# Patient Record
Sex: Male | Born: 1970 | Race: White | Hispanic: No | Marital: Single | Smoking: Never smoker
Health system: Southern US, Community
[De-identification: ages and names within clinical notes are randomized; demographics above are authoritative.]

## PROBLEM LIST (undated history)

## (undated) DIAGNOSIS — I639 Cerebral infarction, unspecified: Secondary | ICD-10-CM

## (undated) DIAGNOSIS — G510 Bell's palsy: Secondary | ICD-10-CM

## (undated) HISTORY — DX: Cerebral infarction, unspecified: I63.9

---

## 2012-02-21 ENCOUNTER — Emergency Department: Payer: Self-pay | Admitting: Emergency Medicine

## 2012-11-26 ENCOUNTER — Emergency Department: Payer: Self-pay | Admitting: Emergency Medicine

## 2012-11-26 LAB — COMPREHENSIVE METABOLIC PANEL
Albumin: 4.3 g/dL (ref 3.4–5.0)
Anion Gap: 2 — ABNORMAL LOW (ref 7–16)
BUN: 11 mg/dL (ref 7–18)
Bilirubin,Total: 0.5 mg/dL (ref 0.2–1.0)
Calcium, Total: 9.3 mg/dL (ref 8.5–10.1)
Chloride: 105 mmol/L (ref 98–107)
Creatinine: 1.07 mg/dL (ref 0.60–1.30)
EGFR (Non-African Amer.): >60
Glucose: 103 mg/dL — ABNORMAL HIGH (ref 65–99)
Osmolality: 275 (ref 275–301)
Potassium: 3.6 mmol/L (ref 3.5–5.1)
SGPT (ALT): 52 U/L (ref 12–78)
Total Protein: 8.5 g/dL — ABNORMAL HIGH (ref 6.4–8.2)

## 2012-11-26 LAB — CBC
HCT: 47.6 % (ref 40.0–52.0)
MCV: 87 fL (ref 80–100)
Platelet: 263 10*3/uL (ref 150–440)
RDW: 13 % (ref 11.5–14.5)
WBC: 9.6 10*3/uL (ref 3.8–10.6)

## 2017-05-15 ENCOUNTER — Other Ambulatory Visit: Payer: Self-pay

## 2017-05-15 ENCOUNTER — Encounter: Payer: Self-pay | Admitting: Emergency Medicine

## 2017-05-15 ENCOUNTER — Emergency Department
Admission: EM | Admit: 2017-05-15 | Discharge: 2017-05-15 | Disposition: A | Discharge status: Home / Self Care | Payer: BLUE CROSS/BLUE SHIELD | Attending: Student in an Organized Health Care Education/Training Program | Admitting: Student in an Organized Health Care Education/Training Program

## 2017-05-15 DIAGNOSIS — G510 Bell's palsy: Secondary | ICD-10-CM | POA: Insufficient documentation

## 2017-05-15 DIAGNOSIS — R29810 Facial weakness: Secondary | ICD-10-CM | POA: Diagnosis present

## 2017-05-15 HISTORY — DX: Bell's palsy: G51.0

## 2017-05-15 MED ORDER — REFRESH LACRI-LUBE OP OINT
1.0000 [drp] | TOPICAL_OINTMENT | Freq: Four times a day (QID) | OPHTHALMIC | 0 refills | Status: DC
Start: 2017-05-15 — End: 2018-11-06

## 2017-05-15 MED ORDER — ACYCLOVIR 400 MG PO TABS: 400.0000 mg | ORAL_TABLET | Freq: Every day | ORAL | 0 refills | Status: AC

## 2017-05-15 MED ORDER — PREDNISONE 20 MG PO TABS: 60.0000 mg | ORAL_TABLET | Freq: Every day | ORAL | 0 refills | Status: AC

## 2017-05-15 NOTE — ED Notes (Signed)
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

## 2017-05-15 NOTE — ED Notes (Addendum)
FIRST NURSE NOTE:  Pt c/o facial droop and left eye pain that started 2-3 days ago, pt smile asymmetrical, and unable to close left eye, pt states he has hx of Bell's Palsy that presented the same.  Pt denies any weakness on one side or difficulty swallowing. Pt ambulatory in lobby, placed in wheelchair.

## 2017-05-15 NOTE — ED Triage Notes (Addendum)
L facial droop x 3 days, cant close eyelid. No other deficits. States history Bells palsy R side 2013. Unalbe to raise L brow. Case reviewed with ERMD, OK to flex.

## 2017-05-15 NOTE — ED Provider Notes (Signed)
[facility] Emergency Department Provider Note   ____________________________________________   First MD Initiated Contact with Patient 05/15/17 [phone]     (approximate)  I have reviewed the triage vital signs and the nursing notes.   HISTORY  Chief Complaint Facial Droop    HPI Joseph Davis is a 47 y.o. male patient complain of left facial droop and cannot close left eye for 2 days.  Patient denies loss of the vision disturbance or vertigo.  Patient has a history of Bell palsy in 2013 that was on the right side.  Patient rates discomfort as a 5/10.  No palliative measures for complaint.  Past Medical History:  Diagnosis Date  . Bell's palsy     There are no active problems to display for this patient.   History reviewed. No pertinent surgical history.  Prior to Admission medications   Medication Sig Start Date End Date Taking? Authorizing Provider  acyclovir (ZOVIRAX) 400 MG tablet Take 1 tablet (400 mg total) by mouth 5 (five) times daily for 10 days. 05/15/17 05/25/17  Joni Reining, PA-C  Artificial Tear Ointment (REFRESH LACRI-LUBE) OINT Apply 1 drop to eye 4 (four) times daily. 05/15/17   Joni Reining, PA-C  predniSONE (DELTASONE) 20 MG tablet Take 3 tablets (60 mg total) by mouth daily with breakfast for 7 days. 05/15/17 05/22/17  Joni Reining, PA-C    Allergies Patient has no known allergies.  No family history on file.  Social History Social History   Tobacco Use  . Smoking status: Not on file  Substance Use Topics  . Alcohol use: Not on file  . Drug use: Not on file    Review of Systems Constitutional: No fever/chills Eyes: No visual changes.  Unable to fully close left eye. ENT: No sore throat. Cardiovascular: Denies chest pain. Respiratory: Denies shortness of breath. Gastrointestinal: No abdominal pain.  No nausea, no vomiting.  No diarrhea.  No constipation. Genitourinary: Negative for  dysuria. Musculoskeletal: Negative for back pain. Skin: Negative for rash. Neurological: Negative for headaches.  Left facial droop and cannot close left eye..   ____________________________________________   PHYSICAL EXAM:  VITAL SIGNS: ED Triage Vitals  Enc Vitals Group     BP 05/15/17 0723 (!) 171/100     Pulse Rate 05/15/17 0723 96     Resp 05/15/17 0723 18     Temp 05/15/17 0723 98.3 F (36.8 C)     Temp src --      SpO2 05/15/17 0723 97 %     Weight 05/15/17 0724 210 lb (95.3 kg)     Height 05/15/17 0724 5' 10.5" (1.791 m)     Head Circumference --      Peak Flow --      Pain Score 05/15/17 0724 5     Pain Loc --      Pain Edu? --      Excl. in GC? --     Constitutional: Alert and oriented. Well appearing and in no acute distress. Eyes: Conjunctivae are normal. PERRL. EOMI. cannot fully close left eye.  Decreased range of motion with of the left eyebrow. Head: Atraumatic. Nose: No congestion/rhinnorhea. Mouth/Throat: Mucous membranes are moist.  Oropharynx non-erythematous. Neck: No stridor.  Hematological/Lymphatic/Immunilogical: No cervical lymphadenopathy. Cardiovascular: Normal rate, regular rhythm. Grossly normal heart sounds.  Good peripheral circulation. Respiratory: Normal respiratory effort.  No retractions. Lungs CTAB. Neurologic:  Normal speech and language.  Partial left facial nerve paralysis deficits are appreciated. No  gait instability. Skin:  Skin is warm, dry and intact. No rash noted. Psychiatric: Mood and affect are normal. Speech and behavior are normal.  ____________________________________________   LABS (all labs ordered are listed, but only abnormal results are displayed)  Labs Reviewed - No data to display ____________________________________________  EKG   ____________________________________________  RADIOLOGY  ED MD interpretation:    Official radiology report(s): No results  found.  ____________________________________________   PROCEDURES  Procedure(s) performed: None  Procedures  Critical Care performed: No  ____________________________________________   INITIAL IMPRESSION / ASSESSMENT AND PLAN / ED COURSE  As part of my medical decision making, I reviewed the following data within the electronic MEDICAL RECORD NUMBER   Bell's palsy left facial area.  Patient given discharge care instruction.  Patient given prescription for acyclovir and prednisone.  Patient advised [facility].  Patient given a work note.      ____________________________________________   FINAL CLINICAL IMPRESSION(S) / ED DIAGNOSES  Final diagnoses:  Bell's palsy     ED Discharge Orders        Ordered    predniSONE (DELTASONE) 20 MG tablet  Daily with breakfast     05/15/17 0831    acyclovir (ZOVIRAX) 400 MG tablet  5 times daily     05/15/17 0831    Artificial Tear Ointment (REFRESH LACRI-LUBE) OINT  4 times daily     05/15/17 [id number]       Note:  This document was prepared using Dragon voice recognition software and may include unintentional dictation errors.    Joni ReiningSmith, Ronald K, PA-C 05/15/17 [id number]    Willy Eddyobinson, Patrick, MD 05/15/17 [phone]

## 2018-10-31 ENCOUNTER — Other Ambulatory Visit: Payer: Self-pay | Admitting: Ophthalmology

## 2018-10-31 ENCOUNTER — Encounter: Payer: Self-pay | Admitting: Family Medicine

## 2018-10-31 DIAGNOSIS — H4901 Third [oculomotor] nerve palsy, right eye: Secondary | ICD-10-CM

## 2018-10-31 LAB — HM DIABETES EYE EXAM

## 2018-11-01 ENCOUNTER — Other Ambulatory Visit: Payer: Self-pay | Admitting: Ophthalmology

## 2018-11-01 ENCOUNTER — Ambulatory Visit
Admission: RE | Admit: 2018-11-01 | Discharge: 2018-11-01 | Disposition: A | Discharge status: Home / Self Care | Payer: BC Managed Care – PPO | Source: Ambulatory Visit | Attending: Ophthalmology | Admitting: Ophthalmology

## 2018-11-01 ENCOUNTER — Other Ambulatory Visit: Payer: Self-pay

## 2018-11-01 DIAGNOSIS — H4901 Third [oculomotor] nerve palsy, right eye: Secondary | ICD-10-CM | POA: Diagnosis present

## 2018-11-01 MED ORDER — GADOBUTROL 1 MMOL/ML IV SOLN
10.0000 mL | Freq: Once | INTRAVENOUS | Status: AC | PRN
  Administered 2018-11-01: 10 mL via INTRAVENOUS

## 2018-11-06 ENCOUNTER — Other Ambulatory Visit: Payer: Self-pay

## 2018-11-06 ENCOUNTER — Ambulatory Visit (INDEPENDENT_AMBULATORY_CARE_PROVIDER_SITE_OTHER): Payer: BC Managed Care – PPO | Admitting: Family Medicine

## 2018-11-06 ENCOUNTER — Encounter: Payer: Self-pay | Admitting: Family Medicine

## 2018-11-06 VITALS — BP 126/68 | HR 97 | Temp 98.9°F | Resp 16 | Ht 69.5 in | Wt 191.0 lb

## 2018-11-06 DIAGNOSIS — Q040 Congenital malformations of corpus callosum: Secondary | ICD-10-CM

## 2018-11-06 DIAGNOSIS — H4901 Third [oculomotor] nerve palsy, right eye: Secondary | ICD-10-CM | POA: Diagnosis not present

## 2018-11-06 DIAGNOSIS — E1165 Type 2 diabetes mellitus with hyperglycemia: Secondary | ICD-10-CM | POA: Diagnosis not present

## 2018-11-06 DIAGNOSIS — E559 Vitamin D deficiency, unspecified: Secondary | ICD-10-CM

## 2018-11-06 DIAGNOSIS — Z8349 Family history of other endocrine, nutritional and metabolic diseases: Secondary | ICD-10-CM | POA: Diagnosis not present

## 2018-11-06 DIAGNOSIS — E538 Deficiency of other specified B group vitamins: Secondary | ICD-10-CM

## 2018-11-06 LAB — COMPREHENSIVE METABOLIC PANEL
ALT: 34 U/L (ref 0–53)
AST: 29 U/L (ref 0–37)
Albumin: 4.7 g/dL (ref 3.5–5.2)
Alkaline Phosphatase: 80 U/L (ref 39–117)
BUN: 18 mg/dL (ref 6–23)
CO2: 28 mEq/L (ref 19–32)
Calcium: 9.9 mg/dL (ref 8.4–10.5)
Chloride: 99 mEq/L (ref 96–112)
Creatinine, Ser: 0.95 mg/dL (ref 0.40–1.50)
GFR: 84.03 mL/min (ref >60.00)
Glucose, Bld: 274 mg/dL — ABNORMAL HIGH (ref 70–99)
Potassium: 4.1 mEq/L (ref 3.5–5.1)
Sodium: 136 mEq/L (ref 135–145)
Total Bilirubin: 0.7 mg/dL (ref 0.2–1.2)
Total Protein: 7.4 g/dL (ref 6.0–8.3)

## 2018-11-06 LAB — B12 AND FOLATE PANEL
Folate: >23.9 ng/mL (ref >5.9)
Vitamin B-12: 209 pg/mL — ABNORMAL LOW (ref 211–911)

## 2018-11-06 LAB — CBC WITH DIFFERENTIAL/PLATELET
Basophils Absolute: 0.1 10*3/uL (ref 0.0–0.1)
Basophils Relative: 0.7 % (ref 0.0–3.0)
Eosinophils Absolute: 0.2 10*3/uL (ref 0.0–0.7)
Eosinophils Relative: 2.6 % (ref 0.0–5.0)
HCT: 45.1 % (ref 39.0–52.0)
Hemoglobin: 15.1 g/dL (ref 13.0–17.0)
Lymphocytes Relative: 22.2 % (ref 12.0–46.0)
Lymphs Abs: 1.6 10*3/uL (ref 0.7–4.0)
MCHC: 33.5 g/dL (ref 30.0–36.0)
MCV: 89.8 fl (ref 78.0–100.0)
Monocytes Absolute: 0.6 10*3/uL (ref 0.1–1.0)
Monocytes Relative: 9 % (ref 3.0–12.0)
Neutro Abs: 4.7 10*3/uL (ref 1.4–7.7)
Neutrophils Relative %: 65.5 % (ref 43.0–77.0)
Platelets: 225 10*3/uL (ref 150.0–400.0)
RBC: 5.02 Mil/uL (ref 4.22–5.81)
RDW: 12.8 % (ref 11.5–15.5)
WBC: 7.2 10*3/uL (ref 4.0–10.5)

## 2018-11-06 LAB — LIPID PANEL
Cholesterol: 171 mg/dL (ref 0–200)
HDL: 35.6 mg/dL — ABNORMAL LOW (ref >39.00)
LDL Cholesterol: 106 mg/dL — ABNORMAL HIGH (ref 0–99)
NonHDL: 134.93
Total CHOL/HDL Ratio: 5
Triglycerides: 146 mg/dL (ref 0.0–149.0)
VLDL: 29.2 mg/dL (ref 0.0–40.0)

## 2018-11-06 LAB — TSH: TSH: 0.78 u[IU]/mL (ref 0.35–4.50)

## 2018-11-06 LAB — VITAMIN D 25 HYDROXY (VIT D DEFICIENCY, FRACTURES): VITD: 28.23 ng/mL — ABNORMAL LOW (ref 30.00–100.00)

## 2018-11-06 LAB — HEMOGLOBIN A1C: Hgb A1c MFr Bld: 11.1 % — ABNORMAL HIGH (ref 4.6–6.5)

## 2018-11-06 MED ORDER — SITAGLIPTIN PHOSPHATE 50 MG PO TABS: 50.0000 mg | ORAL_TABLET | Freq: Every day | ORAL | 1 refills | Status: DC

## 2018-11-06 MED ORDER — CYANOCOBALAMIN 500 MCG PO TABS: 500.0000 ug | ORAL_TABLET | Freq: Every day | ORAL | 0 refills | Status: DC

## 2018-11-06 MED ORDER — METFORMIN HCL 1000 MG PO TABS: ORAL_TABLET | ORAL | 3 refills | Status: DC

## 2018-11-06 MED ORDER — ERGOCALCIFEROL 50 MCG (2000 UT) PO TABS: 1.0000 | ORAL_TABLET | Freq: Every day | ORAL | 1 refills | Status: DC

## 2018-11-06 MED ORDER — BLOOD GLUCOSE METER KIT: PACK | 0 refills | Status: DC

## 2018-11-06 NOTE — Progress Notes (Addendum)
Subjective:    Patient ID: Joseph Davis, male    DOB: 10-13-1970, 48 y.o.   MRN: [id number]  HPI  Patient presents to clinic as a new patient with complaint of right eye being swollen.  Patient does have a history of Bell's palsy (right eye/side of face) that was diagnosed in February 2019 after he had shingles.   He was also seen on 10/26/2018 by neuro clinic and diagnosed with a corneal abrasion of right eye.  He was prescribed erythromycin ointment.  He was referred to ophthalmology, saw Dr. Druscilla BrowniePorfilio at [facility]. MRI/MRA done to investigate suspected 3rd nerve palsy of right eye; I was able to review imaging. They have opthalmology follow up scheduled for 11/30/2018. EXAM: MRI HEAD WITHOUT AND WITH CONTRAST  MRA HEAD WITHOUT CONTRAST  TECHNIQUE: Multiplanar, multiecho pulse sequences of the brain and surrounding structures were obtained without and with intravenous contrast. Angiographic images of the head were obtained using MRA technique without contrast.  CONTRAST:  10 mL Gadovist IV  COMPARISON:  CT head 11/26/2012  FINDINGS: MRI HEAD FINDINGS  Brain: Complete agenesis corpus callosum. No lipoma. 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  Negative for acute infarct. Mild white matter disease bilaterally mostly in the parietal white matter. Scattered small white matter hyperintensities also in the frontal lobes. 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 Chronic microhemorrhage associated with the right frontal chronic infarct. Chronic microhemorrhage also in the right posterior temporal lobe and left cerebellum.  Negative for mass or edema. Normal enhancement postcontrast administration.  Vascular: Normal arterial flow voids.  Skull and upper cervical spine: Negative  Sinuses/Orbits: Mucosal edema left maxillary sinus.  Normal orbit  Other: None  MRA HEAD FINDINGS  Both vertebral  arteries widely patent to the basilar. Right PICA patent. Prominent left AICA appears to supply left PICA territory. Basilar widely patent. The superior cerebellar and posterior cerebral arteries are patent bilaterally. Patent posterior communicating artery on the left. Negative for posterior communicating artery aneurysm.  Internal carotid artery widely patent bilaterally. Anterior and middle cerebral arteries widely patent bilaterally without stenosis.  Negative for cerebral aneurysm.  IMPRESSION: 1. Negative for cerebral aneurysm. No cause for right-sided third nerve palsy 2. Complete agenesis corpus callosum 3. Mild chronic ischemic changes in the white matter. 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 Additional small areas of microhemorrhage in the right posterior temporal lobe and left cerebellum. Correlate with history of hypertension. No acute Infarct.  Patient Active Problem List   Diagnosis Date Noted  . Agenesis of corpus callosum (HCC) 11/06/2018  . Family history of endocrine and metabolic disease 78/29/562107/28/2020  . 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 Bell's palsy   . Stroke [facility](HCC)    History reviewed. No pertinent surgical history.  Family History  Problem Relation Age of Onset  . Diabetes Mother   . Diabetes Father     Review of Systems   Constitutional: Negative for chills, fatigue and fever.  HENT: Negative for congestion, ear pain, sinus pain and sore throat.   Eyes: Negative.   Respiratory: Negative for cough, shortness of breath and wheezing.   Cardiovascular: Negative for chest pain, palpitations and leg swelling.  Gastrointestinal: Negative for abdominal pain, diarrhea, nausea and vomiting.  Genitourinary: Negative for dysuria, frequency and urgency.  Musculoskeletal: Negative for arthralgias and myalgias.  Skin: Negative for color change, pallor and rash.  Neurological: Negative for  syncope, light-headedness and headaches. +right eye shut Psychiatric/Behavioral: The patient is not nervous/anxious.       Objective:   Physical Exam Vitals signs and nursing note reviewed.  Constitutional:      General: He is not in acute distress.    Appearance: He is normal weight. He is not ill-appearing, toxic-appearing or diaphoretic.  HENT:     Head: Atraumatic.     Right Ear: Tympanic membrane, ear canal and external ear normal.     Left Ear: Tympanic membrane, ear canal and external ear normal.  Eyes:     General: No scleral icterus.       Right eye: No discharge.        Left eye: No discharge.     Extraocular Movements: Extraocular movements intact.     Pupils: Pupils are equal, round, and reactive to light.     Comments: Right eye is closed. Unable to open right eyelid on his own. When I manually open lid, ROM of eye is intact and no discharge seen from right eye.   Neck:     Musculoskeletal: Normal range of motion and neck supple.     Vascular: No carotid bruit.  Cardiovascular:     Rate and Rhythm: Normal rate and regular rhythm.     Heart sounds: Normal heart sounds.  Pulmonary:     Effort: Pulmonary effort is normal. No respiratory distress.     Breath sounds: Normal breath sounds.  Musculoskeletal:     Right lower leg: No edema.     Left lower leg: No edema.  Skin:    General: Skin is warm and dry.     Capillary Refill: Capillary refill takes less than 2 seconds.     Coloration: Skin is not jaundiced or pale.  Neurological:     Mental Status: He is alert and oriented to person, place, and time.     Gait: Gait normal.  Psychiatric:        Mood and Affect: Mood normal.        Behavior: Behavior normal.     Vitals:   11/06/18 0904  BP: 126/68  Pulse: 97  Resp: 16  Temp: 98.9 F (37.2 C)  SpO2: 93%   Body mass index is 27.8 kg/m.     Assessment & Plan:   Third nerve palsy right eye, Agenesis of corpus callosum --MRI and MRA imaging of brain did  not reveal any cause for the.  It did find agenesis of corpus callosum.  I will place a neurology referral for further evaluation of reason why right eyelid is shut and for any monitoring/further work up that may be needed for agenesis of corpus callosum & finding of chronic right frontal infarct noted on brain imaging.  Corneal Abrasion --no signs of infection noted in right eye.  We will keep appointment as planned in August with ophthalmology for continued monitoring.  Family history of endocrine/metabolic disorder - patient has diabetes both in his mother's and father's family history, so we will get blood work today including CBC, lipid panel, thyroid panel and A1c.  Type 2 DM -- patient's A1c and lab work feels he is a diabetic.  We will start him on metformin and Januvia and send him to diabetes education.   B12 deficiency/vitamin D deficiency-lab work reveals patient is deficient in B12 vitamin D.  Will replace.  Patient and mother made aware that they do not hear anything about the neurology referral in the next few days  to call office and let us know.  Advised if any symptoms change or anything new develops to call office right away for advice and or go to ER for evaluation.  We will plan to have patient follow-up in office in the next 2 or 3 weeks to see how he is tolerating new medications.

## 2018-11-06 NOTE — Addendum Note (Signed)
Addended by: Philis Nettle on: 11/06/2018 02:58 PM   Modules accepted: Orders, Level of Service

## 2018-11-12 ENCOUNTER — Ambulatory Visit: Payer: BLUE CROSS/BLUE SHIELD

## 2018-11-20 ENCOUNTER — Other Ambulatory Visit: Payer: Self-pay

## 2018-11-22 ENCOUNTER — Other Ambulatory Visit: Payer: Self-pay

## 2018-11-22 ENCOUNTER — Encounter: Payer: Self-pay | Admitting: Family Medicine

## 2018-11-22 ENCOUNTER — Ambulatory Visit (INDEPENDENT_AMBULATORY_CARE_PROVIDER_SITE_OTHER): Payer: BC Managed Care – PPO | Admitting: Family Medicine

## 2018-11-22 VITALS — BP 128/82 | HR 76 | Temp 97.3°F | Resp 16 | Ht 69.5 in | Wt 190.2 lb

## 2018-11-22 DIAGNOSIS — Q040 Congenital malformations of corpus callosum: Secondary | ICD-10-CM

## 2018-11-22 DIAGNOSIS — E1165 Type 2 diabetes mellitus with hyperglycemia: Secondary | ICD-10-CM

## 2018-11-22 DIAGNOSIS — E538 Deficiency of other specified B group vitamins: Secondary | ICD-10-CM | POA: Diagnosis not present

## 2018-11-22 DIAGNOSIS — E559 Vitamin D deficiency, unspecified: Secondary | ICD-10-CM | POA: Diagnosis not present

## 2018-11-22 DIAGNOSIS — H4901 Third [oculomotor] nerve palsy, right eye: Secondary | ICD-10-CM

## 2018-11-22 NOTE — Patient Instructions (Signed)
Keep up the great work!

## 2018-11-22 NOTE — Progress Notes (Signed)
Subjective:    Patient ID: Joseph Davis, male    DOB: 1970/11/29, 48 y.o.   MRN: [id number]  HPI   Patient presents to clinic for follow-up on diabetes after we discovered he was diabetic through lab work and starting new medicines.  He is currently taking the metformin twice a day and Januvia once daily.  He has been tracking his blood sugars and even over the past 2 weeks he has noticed a trend down.  He is now running mainly in the 120s to 190s.  Denies any hypoglycemia.  Also states he has been working on making better food choices now that he knows he is diabetic and has been making sure he is especially avoiding regular sodas and high sugar content foods.  He is tolerating the new medications without any adverse effects, denies any abdominal discomfort.  Also is tolerating oral vitamin B12 and vitamin D without any issues.  He was revealed to be low in his blood work.  3rd nerve palsy of right eye seems to be slowly improving.  Right eye is now open.  He does have scheduled follow-up with ophthalmology on August 21.  Lab Results  Component Value Date   HGBA1C 11.1 (H) 11/06/2018   CMP Latest Ref Rng & Units 11/06/2018 11/26/2012  Glucose 70 - 99 mg/dL 409(W274(H) 119(J103(H)  BUN 6 - 23 mg/dL 18 11  Creatinine 4.780.40 - 1.50 mg/dL 2.950.95 6.211.07  Sodium 308135 - 145 mEq/L 136 138  Potassium 3.5 - 5.1 mEq/L 4.1 3.6  Chloride 96 - 112 mEq/L 99 105  CO2 19 - 32 mEq/L 28 31  Calcium 8.4 - 10.5 mg/dL 9.9 9.3  Total Protein 6.0 - 8.3 g/dL 7.4 6.5(H8.5(H)  Total Bilirubin 0.2 - 1.2 mg/dL 0.7 0.5  Alkaline Phos 39 - 117 U/L 80 83  AST 0 - 37 U/L 29 54(H)  ALT 0 - 53 U/L 34 52   Patient Active Problem List   Diagnosis Date Noted  . Agenesis of corpus callosum (HCC) 11/06/2018  . Family history of endocrine and metabolic disease 84/69/629507/28/2020  . 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 Smoking status: Never Smoker  . Smokeless tobacco: Never Used  Substance  Use Topics  . Alcohol use: Never    Frequency: Never     Review of Systems   Constitutional: Negative for chills, fatigue and fever.  HENT: Negative for congestion, ear pain, sinus pain and sore throat.   Eyes: right eye now open Respiratory: Negative for cough, shortness of breath and wheezing.   Cardiovascular: Negative for chest pain, palpitations and leg swelling.  Gastrointestinal: Negative for abdominal pain, diarrhea, nausea and vomiting.  Genitourinary: Negative for dysuria, frequency and urgency.  Musculoskeletal: Negative for arthralgias and myalgias.  Skin: Negative for color change, pallor and rash.  Neurological: Negative for syncope, light-headedness and headaches.  Psychiatric/Behavioral: The patient is not nervous/anxious.       Objective:   Physical Exam Vitals signs and nursing note reviewed.  Constitutional:      General: He is not in acute distress.    Appearance: He is not ill-appearing, toxic-appearing or diaphoretic.  HENT:     Head: Normocephalic and atraumatic.     Right Ear: Tympanic membrane, ear canal and external ear normal.     Left Ear: Tympanic membrane, ear canal and external ear normal.  Eyes:     General: No scleral icterus.  Extraocular Movements: Extraocular movements intact.     Pupils: Pupils are equal, round, and reactive to light.     Comments: Right eye is now open.  Cardiovascular:     Rate and Rhythm: Normal rate and regular rhythm.     Heart sounds: Normal heart sounds.  Pulmonary:     Effort: Pulmonary effort is normal.     Breath sounds: Normal breath sounds.  Musculoskeletal:     Right lower leg: No edema.     Left lower leg: No edema.  Skin:    General: Skin is warm and dry.     Capillary Refill: Capillary refill takes less than 2 seconds.     Coloration: Skin is not jaundiced or pale.  Neurological:     Mental Status: He is alert and oriented to person, place, and time.     Gait: Gait normal.  Psychiatric:         Mood and Affect: Mood normal.        Behavior: Behavior normal.     Today's Vitals   11/22/18 0819  BP: 128/82  Pulse: 76  Resp: 16  Temp: (!) 97.3 F (36.3 C)  TempSrc: Oral  SpO2: 96%  Weight: 190 lb 3.2 oz (86.3 kg)  Height: 5' 9.5" (1.765 m)   Body mass index is 27.68 kg/m.     Assessment & Plan:   Type 2 diabetes-patient tolerating metformin and Januvia without any problems.  Blood sugars have already started to respond to these medications.  He will continue these medicines and also check blood sugar 1-2 times a day and keep a log of his readings.  Reviewed healthy diet and watching for carbohydrates and added sugars and foods.  Vitamin D and B12 deficiency-he will continue vitamin D and vitamin B12 oral supplements.  We will plan to recheck these levels at next visit in his blood work.  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  Agenesis of corpus callosum --neurology referral is in place.   Patient will follow-up in October 2020 as already planned and we will do new blood work at that time.  He is aware he can return to clinic sooner if any issues arise.

## 2019-02-07 ENCOUNTER — Telehealth: Payer: Self-pay | Admitting: Family Medicine

## 2019-02-07 NOTE — Telephone Encounter (Signed)
Left multiple messages to confirm appt for 10/30 and to go over travel screening  Pt hasn't returned call

## 2019-02-08 ENCOUNTER — Ambulatory Visit (INDEPENDENT_AMBULATORY_CARE_PROVIDER_SITE_OTHER): Payer: BC Managed Care – PPO | Admitting: Family Medicine

## 2019-02-08 ENCOUNTER — Other Ambulatory Visit: Payer: Self-pay

## 2019-02-08 VITALS — BP 140/78 | HR 68 | Temp 97.7°F | Resp 16 | Ht 69.5 in | Wt 182.0 lb

## 2019-02-08 DIAGNOSIS — E1165 Type 2 diabetes mellitus with hyperglycemia: Secondary | ICD-10-CM | POA: Diagnosis not present

## 2019-02-08 DIAGNOSIS — E538 Deficiency of other specified B group vitamins: Secondary | ICD-10-CM | POA: Diagnosis not present

## 2019-02-08 DIAGNOSIS — E559 Vitamin D deficiency, unspecified: Secondary | ICD-10-CM | POA: Diagnosis not present

## 2019-02-08 DIAGNOSIS — Z23 Encounter for immunization: Secondary | ICD-10-CM

## 2019-02-08 LAB — BASIC METABOLIC PANEL
BUN: 10 mg/dL (ref 6–23)
CO2: 31 mEq/L (ref 19–32)
Calcium: 9.5 mg/dL (ref 8.4–10.5)
Chloride: 103 mEq/L (ref 96–112)
Creatinine, Ser: 0.86 mg/dL (ref 0.40–1.50)
GFR: 94.16 mL/min (ref >60.00)
Glucose, Bld: 114 mg/dL — ABNORMAL HIGH (ref 70–99)
Potassium: 3.9 mEq/L (ref 3.5–5.1)
Sodium: 141 mEq/L (ref 135–145)

## 2019-02-08 LAB — VITAMIN B12: Vitamin B-12: 229 pg/mL (ref 211–911)

## 2019-02-08 LAB — HEMOGLOBIN A1C: Hgb A1c MFr Bld: 6.2 % (ref 4.6–6.5)

## 2019-02-08 LAB — VITAMIN D 25 HYDROXY (VIT D DEFICIENCY, FRACTURES): VITD: 28.17 ng/mL — ABNORMAL LOW (ref 30.00–100.00)

## 2019-02-08 NOTE — Progress Notes (Signed)
Subjective:    Patient ID: Joseph Davis, male    DOB: 1970/10/22, 48 y.o.   MRN: [id number]  HPI   Patient presents to clinic for follow-up on his diabetes.  We will also follow-up on vitamin D and B12 deficiency since we have had him taking supplements for the past 3 months.  Patient doing well with Metformin.  Checking his blood sugar usually twice per day.  Ranges usually from 90-140s.  Denies any hypoglycemia.  Denies any readings over 200.  Patient Active Problem List   Diagnosis Date Noted  . Agenesis of corpus callosum ([place]) 11/06/2018  . Family history of endocrine and metabolic disease 70/62/3762  . 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 Smoking status: Never Smoker  . Smokeless tobacco: Never Used  Substance Use Topics  . Alcohol use: Never    Frequency: Never   Review of Systems  Constitutional: Negative for chills, fatigue and fever.  HENT: Negative for congestion, ear pain, sinus pain and sore throat.   Eyes: Negative.   Respiratory: Negative for cough, shortness of breath and wheezing.   Cardiovascular: Negative for chest pain, palpitations and leg swelling.  Gastrointestinal: Negative for abdominal pain, diarrhea, nausea and vomiting.  Genitourinary: Negative for dysuria, frequency and urgency.  Musculoskeletal: Negative for arthralgias and myalgias.  Skin: Negative for color change, pallor and rash.  Neurological: Negative for syncope, light-headedness and headaches.  Psychiatric/Behavioral: The patient is not nervous/anxious.       Objective:   Physical Exam Vitals signs and nursing note reviewed.  Constitutional:      General: He is not in acute distress.    Appearance: He is not ill-appearing, toxic-appearing or diaphoretic.  HENT:     Head: Normocephalic and atraumatic.  Cardiovascular:     Rate and Rhythm: Normal rate and regular rhythm.     Heart sounds: Normal heart sounds.  Pulmonary:   Effort: Pulmonary effort is normal. No respiratory distress.  Musculoskeletal:     Right lower leg: No edema.     Left lower leg: No edema.  Skin:    General: Skin is warm and dry.     Coloration: Skin is not jaundiced or pale.  Neurological:     Mental Status: He is alert and oriented to person, place, and time.     Gait: Gait normal.  Psychiatric:        Mood and Affect: Mood normal.        Behavior: Behavior normal.    Today's Vitals   02/08/19 0953  BP: 140/78  Pulse: 68  Resp: 16  Temp: 97.7 F (36.5 C)  SpO2: 98%  Weight: 182 lb (82.6 kg)  Height: 5' 9.5" (1.765 m)      Assessment & Plan:    Type 2 diabetes mellitus with hyperglycemia, without long-term current use of insulin (HCC) - Plan: Hemoglobin G3T, Basic metabolic panel  D17 deficiency - Plan: B12  Vitamin D deficiency - Plan: VITAMIN D 25 Hydroxy (Vit-D Deficiency, Fractures)  Need for influenza vaccination - Plan: Flu Vaccine QUAD 6+ mos PF IM (Fluarix Quad PF)  I am very impressed with patient's diabetes numbers.  We will get new A1c today.  He will also get B12 and vitamin D levels to follow-up on how well the oral replacement is working.  Flu vaccine given in clinic.  Patient will continue on current regimen unless any labs reveal we need to make  changes.  He will follow-up regularly every 6 months for recheck.  He can return to clinic sooner whenever needed.

## 2019-02-08 NOTE — Patient Instructions (Signed)
You are doing amazing! Keep up the great work!

## 2019-02-11 MED ORDER — CYANOCOBALAMIN 500 MCG PO TABS: 500.0000 ug | ORAL_TABLET | Freq: Every day | ORAL | 0 refills | Status: DC

## 2019-02-11 MED ORDER — ERGOCALCIFEROL 50 MCG (2000 UT) PO TABS: 1.0000 | ORAL_TABLET | Freq: Every day | ORAL | 1 refills | Status: DC

## 2019-02-11 MED ORDER — SITAGLIPTIN PHOSPHATE 50 MG PO TABS: 50.0000 mg | ORAL_TABLET | Freq: Every day | ORAL | 1 refills | Status: DC

## 2019-02-11 NOTE — Addendum Note (Signed)
Addended by: Philis Nettle on: 02/11/2019 08:08 AM   Modules accepted: Orders

## 2019-02-26 ENCOUNTER — Encounter: Payer: Self-pay | Admitting: *Deleted

## 2019-05-17 ENCOUNTER — Encounter: Payer: Self-pay | Admitting: Family

## 2019-05-17 ENCOUNTER — Telehealth: Payer: Self-pay | Admitting: Family

## 2019-05-17 ENCOUNTER — Other Ambulatory Visit: Payer: Self-pay

## 2019-05-17 ENCOUNTER — Ambulatory Visit (INDEPENDENT_AMBULATORY_CARE_PROVIDER_SITE_OTHER): Payer: BC Managed Care – PPO | Admitting: Family

## 2019-05-17 VITALS — BP 130/82 | HR 79 | Temp 97.8°F | Ht 69.5 in | Wt 185.6 lb

## 2019-05-17 DIAGNOSIS — E559 Vitamin D deficiency, unspecified: Secondary | ICD-10-CM | POA: Diagnosis not present

## 2019-05-17 DIAGNOSIS — Z125 Encounter for screening for malignant neoplasm of prostate: Secondary | ICD-10-CM | POA: Diagnosis not present

## 2019-05-17 DIAGNOSIS — E1165 Type 2 diabetes mellitus with hyperglycemia: Secondary | ICD-10-CM | POA: Diagnosis not present

## 2019-05-17 DIAGNOSIS — R079 Chest pain, unspecified: Secondary | ICD-10-CM

## 2019-05-17 DIAGNOSIS — E538 Deficiency of other specified B group vitamins: Secondary | ICD-10-CM | POA: Diagnosis not present

## 2019-05-17 DIAGNOSIS — Z23 Encounter for immunization: Secondary | ICD-10-CM

## 2019-05-17 DIAGNOSIS — I10 Essential (primary) hypertension: Secondary | ICD-10-CM | POA: Insufficient documentation

## 2019-05-17 DIAGNOSIS — Z1211 Encounter for screening for malignant neoplasm of colon: Secondary | ICD-10-CM

## 2019-05-17 MED ORDER — METFORMIN HCL 1000 MG PO TABS: ORAL_TABLET | ORAL | 3 refills | Status: DC

## 2019-05-17 MED ORDER — CYANOCOBALAMIN 500 MCG PO TABS: 500.0000 ug | ORAL_TABLET | Freq: Every day | ORAL | 4 refills | Status: DC

## 2019-05-17 MED ORDER — ERGOCALCIFEROL 50 MCG (2000 UT) PO CAPS: 1.0000 | ORAL_CAPSULE | Freq: Every day | ORAL | 4 refills | Status: DC

## 2019-05-17 NOTE — Assessment & Plan Note (Signed)
Discussed at length with patient episodes of chest pain which she had.  Last episode a month ago. EKG  shows normal sinus rhythm, heart rate 74.  Unfortunately no prior EKG to compare to.  I was able to produce pain on exam.  Discussed with patient that likely his symptoms are musculoskeletal based on my exam.  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  I also emphasized the patient and mother if patient if he were to have another episode of pain, I would like for him to seek immediate medical attention and go to the nearest emergency room due age, CVD risk.  Patient verbalized understanding.

## 2019-05-17 NOTE — Addendum Note (Signed)
Addended by: Warden Fillers on: 05/17/2019 10:52 AM   Modules accepted: Orders

## 2019-05-17 NOTE — Assessment & Plan Note (Addendum)
Lab Results  Component Value Date   HGBA1C 6.2 02/08/2019   Has been controlled however Januvia is costing $120 a month.  I have placed a chronic care management referral for further guidance with this.

## 2019-05-17 NOTE — Progress Notes (Signed)
Subjective:    Patient ID: Joseph Davis, male    DOB: July 22, 1970, 49 y.o.   MRN: [id number]  CC: Joseph Davis is a 49 y.o. male who presents today to establish care.    HPI: Here to establish care.  Transferring care.  Accompanied by his mother today. History of CVA, Bell's palsy. Diabetes-compliant with Metformin 1000 mg twice daily.  Fasting blood glucose average less than 120.  No numbness in his feet.  Breakfast consists of typically oatmeal, lunch half sandwich with chips and supper meat with potatoes and beans.  Focuses on portion control.  He may have a sugar.  Drinks diet soda. He does complain of a sharp left sided chest pain that first occurred a month ago.  The only occurs "once in a blue moon".  It may occur with activity or when he sitting still.  Last episode 1 month ago.  He states he had approximately 1 or 2 of these in a day last for 2 seconds resolved on its own.  Episode is not associate with nausea, left arm pain or numbness, a meal, shortness of breath, diaphoresis.  He works at [employer]; he does stocking.  He does not feel he has had any injury.  He has never been to cardiology.  No trouble urinating, hesistancy   HISTORY:  Past Medical History:  Diagnosis Date  . Bell's palsy   . Stroke [facility])    History reviewed. No pertinent surgical history. Family History  Problem Relation Age of Onset  . Diabetes Mother   . Diabetes Father     Allergies: Patient has no known allergies. Current Outpatient Medications on File Prior to Visit  Medication Sig Dispense Refill  . blood glucose meter kit and supplies Dispense based on patient and insurance preference. Use up to four times daily as directed. (FOR ICD-10 E10.9, E11.9).  Check blood sugar up to 4 times per day. 1 each 0  . sitaGLIPtin (JANUVIA) 50 MG tablet Take 1 tablet (50 mg total) by mouth daily. 90 tablet 1   No current facility-administered medications on file prior to visit.    Social  History   Tobacco Use  . Smoking status: Never Smoker  . Smokeless tobacco: Never Used  Substance Use Topics  . Alcohol use: Never  . Drug use: Never    Review of Systems  Constitutional: Negative for chills and fever.  Respiratory: Negative for cough, chest tightness and shortness of breath.   Cardiovascular: Positive for chest pain. Negative for palpitations and leg swelling.  Gastrointestinal: Negative for nausea and vomiting.  Genitourinary: Negative for decreased urine volume and difficulty urinating.  Neurological: Negative for numbness and headaches.      Objective:    BP 130/82   Pulse 79   Temp 97.8 F (36.6 C) (Temporal)   Ht 5' 9.5" (1.765 m)   Wt 185 lb 9.6 oz (84.2 kg)   SpO2 97%   BMI 27.02 kg/m  BP Readings from Last 3 Encounters:  05/17/19 130/82  02/08/19 140/78  11/22/18 128/82   Wt Readings from Last 3 Encounters:  05/17/19 185 lb 9.6 oz (84.2 kg)  02/08/19 182 lb (82.6 kg)  11/22/18 190 lb 3.2 oz (86.3 kg)    Physical Exam Vitals reviewed.  Constitutional:      Appearance: He is well-developed.  Cardiovascular:     Rate and Rhythm: Regular rhythm.     Heart sounds: Normal heart sounds.  Pulmonary:     Effort:  Pulmonary effort is normal. No respiratory distress.     Breath sounds: Normal breath sounds. No wheezing, rhonchi or rales.  Chest:       Comments: Palpable area of tenderness as marked on diagram, over rib cage/ midclavicular line. Musculoskeletal:     Right shoulder: Normal.     Left shoulder: Normal.     Comments: NO chest pain with moving of shoulders.  Skin:    General: Skin is warm and dry.  Neurological:     Mental Status: He is alert.  Psychiatric:        Speech: Speech normal.        Behavior: Behavior normal.        Assessment & Plan:   Problem List Items Addressed This Visit      Cardiovascular and Mediastinum   HTN (hypertension)    BP Readings from Last 3 Encounters:  05/17/19 130/82  02/08/19 140/78    11/22/18 128/82   Blood pressures been consistently greater than 120/80.  Particularly in setting of DM ,We will start losartan today for HTN control, renal protection.Will likely start asa 53m and statin. Labs in one week. Close f/u        Endocrine   Type 2 diabetes mellitus with hyperglycemia, without long-term current use of insulin ([place]    Lab Results  Component Value Date   HGBA1C 6.2 02/08/2019   Has been controlled however Januvia is costing $120 a month.  I have placed a chronic care management referral for further guidance with this.      Relevant Orders   Referral to Chronic Care Management Services   Hemoglobin A1c   Comprehensive metabolic panel   Lipid panel   Microalbumin / creatinine urine ratio     Other   B12 deficiency   Chest pain    Discussed at length with patient episodes of chest pain which she had.  Last episode a month ago. EKG  shows normal sinus rhythm, heart rate 74.  Unfortunately no prior EKG to compare to.  I was able to produce pain on exam.  Discussed with patient that likely his symptoms are musculoskeletal based on my exam.  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  I also emphasized the patient and mother if patient if he were to have another episode of pain, I would like for him to seek immediate medical attention and go to the nearest emergency room due age, CVD risk.  Patient verbalized understanding.       Other Visit Diagnoses    Vitamin D deficiency       Screening for prostate cancer       Relevant Orders   PSA   Screen for colon cancer       Relevant Orders   Ambulatory referral to Gastroenterology   Need for 23-polyvalent pneumococcal polysaccharide vaccine       Relevant Orders   Pneumococcal polysaccharide vaccine 23-valent greater than or equal to 2yo subcutaneous/IM (Completed)   Need for Tdap vaccination       Relevant Orders    Tdap vaccine greater than or equal to 7yo IM (Completed)    Of note, he will restart VitD and B12 since didn't start 01/2019.    I have discontinued CByrd Hesselbachvitamin B-12 and Ergocalciferol. I am also having him start on Ergocalciferol and vitamin B-12. Additionally, I am having him maintain his blood  glucose meter kit and supplies and sitaGLIPtin.   Meds ordered this encounter  Medications  . Ergocalciferol 50 MCG (2000 UT) CAPS    Sig: Take 1 capsule by mouth daily.    Dispense:  30 capsule    Refill:  4  . vitamin B-12 (V-R VITAMIN B-12) 500 MCG tablet    Sig: Take 1 tablet (500 mcg total) by mouth daily.    Dispense:  30 tablet    Refill:  4    Return precautions given.   Risks, benefits, and alternatives of the medications and treatment plan prescribed today were discussed, and patient expressed understanding.   Education regarding symptom management and diagnosis given to patient on AVS.  Continue to follow with Burnard Hawthorne, FNP for routine health maintenance.   [name] and I agreed with plan.   [name], FNP

## 2019-05-17 NOTE — Patient Instructions (Addendum)
Labs in one week Start losartan for blood pressure Monitor blood pressure,  Goal is less than 120/80, based on newest guidelines; if persistently higher, please make sooner follow up appointment so we can recheck you blood pressure and manage medications  Continue januvia until I hear back from pharmacy; do NOT fill again as it is TOO expensive  Any recurrence of chest pain, as we discussed at length today, I would like for you to go medially to the emergency room.  You may trial over-the-counter Aspercreme or Biofreeze over the left-sided chest wall.  Stay safe!     Heart Attack A heart attack occurs when blood and oxygen supply to the heart is cut off. A heart attack causes damage to the heart that cannot be fixed. A heart attack is also called a myocardial infarction, or MI. If you think you are having a heart attack, do not wait to see if the symptoms will go away. Get medical help right away. What are the causes? This condition may be caused by:  A fatty substance (plaque) in the blood vessels (arteries). This can block the flow of blood to the heart.  A blood clot in the blood vessels that go to the heart. The blood clot blocks blood flow.  Low blood pressure.  An abnormal heartbeat.  Some diseases, such as problems in red blood cells (anemia)orproblems in breathing (respiratory failure).  Tightening (spasm) of a blood vessel that cuts off blood to the heart.  A tear in a blood vessel of the heart.  High blood pressure. What increases the risk? The following factors may make you more likely to develop this condition:  Aging. The older you are, the higher your risk.  Having a personal or family history of chest pain, heart attack, stroke, or narrowing of the arteries in the legs, arms, head, or stomach (peripheral artery disease).  Being male.  Smoking.  Not getting regular exercise.  Being overweight or obese.  Having high blood pressure.  Having high  cholesterol.  Having diabetes.  Drinking too much alcohol.  Using illegal drugs, such as cocaine or methamphetamine. What are the signs or symptoms? Symptoms of this condition include:  Chest pain. It may feel like: ? Crushing or squeezing. ? Tightness, pressure, fullness, or heaviness.  Pain in the arm, neck, jaw, back, or upper body.  Shortness of breath.  Heartburn.  Upset stomach (indigestion).  Feeling like you may vomit (nauseous).  Cold sweats.  Feeling tired.  Sudden light-headedness. How is this treated? A heart attack must be treated as soon as possible. Treatment may include:  Medicines to: ? Break up or dissolve blood clots. ? Thin blood and help prevent blood clots. ? Treat blood pressure. ? Improve blood flow to the heart. ? Reduce pain. ? Reduce cholesterol.  Procedures to widen a blocked artery and keep it open.  Open heart surgery.  Receiving oxygen.  Making your heart strong again (cardiac rehabilitation) through exercise, education, and counseling. Follow these instructions at home: Medicines  Take over-the-counter and prescription medicines only as told by your doctor. You may need to take medicine: ? To keep your blood from clotting too easily. ? To control blood pressure. ? To lower cholesterol. ? To control heart rhythms.  Do not take these medicines unless your doctor says it is okay: ? NSAIDs, such as ibuprofen. ? Supplements that have vitamin A, vitamin E, or both. ? Hormone replacement therapy that has estrogen with or without progestin. Lifestyle  Do not use any products that have nicotine or tobacco, such as cigarettes, e-cigarettes, and chewing tobacco. If you need help quitting, ask your doctor.  Avoid secondhand smoke.  Exercise regularly. Ask your doctor about a cardiac rehab program.  Eat heart-healthy foods. Your doctor will tell you what foods to eat.  Stay at a healthy weight.  Lower your stress  level.  Do not use illegal drugs. Alcohol use  Do not drink alcohol if: ? Your doctor tells you not to drink. ? You are pregnant, may be pregnant, or are planning to become pregnant.  If you drink alcohol: ? Limit how much you use to:  0-1 drink a day for women.  0-2 drinks a day for men. ? Know how much alcohol is in your drink. In the [place] one drink equals one 12 oz bottle of beer (355 mL), one 5 oz glass of wine (148 mL), or one 1 oz glass of hard liquor (44 mL). General instructions  Work with your doctor to treat other problems you may have, such as diabetes or high blood pressure.  Get screened for depression. Get treatment if needed.  Keep your vaccines up to date. Get the flu shot (influenza vaccine) every year.  Keep all follow-up visits as told by your doctor. This is important. Contact a doctor if:  You feel very sad.  You have trouble doing your daily activities. Get help right away if:  You have sudden, unexplained discomfort in your chest, arms, back, neck, jaw, or upper body.  You have shortness of breath.  You have sudden sweating or clammy skin.  You feel like you may vomit.  You vomit.  You feel tired or weak.  You get light-headed or dizzy.  You feel your heart beating fast.  You feel your heart skipping beats.  You have blood pressure that is higher than 180/120. These symptoms may be an emergency. Do not wait to see if the symptoms will go away. Get medical help right away. Call your local emergency services (911 in the [place] Do not drive yourself to the hospital. Summary  A heart attack occurs when blood and oxygen supply to the heart is cut off.  Do not take NSAIDs unless your doctor says it is okay.  Do not smoke. Avoid secondhand smoke.  Exercise regularly. Ask your doctor about a cardiac rehab program. This information is not intended to replace advice given to you by your health care provider. Make sure you discuss any  questions you have with your health care provider. Document Revised: 07/09/2018 Document Reviewed: 07/09/2018 Elsevier Patient Education  [place].

## 2019-05-17 NOTE — Telephone Encounter (Signed)
Call pt I wanted to further ask patient and mother if they would like a cardiac evaluation.  Although EKG did not reveal any acute findings today.  Based on his h/o stroke, hypertension, diabetes, I do not think it is unreasonable to have risk ratification done with cardiology.  They will order tests such as a stress test if appropriate.  Would they like me to place referral?

## 2019-05-17 NOTE — Assessment & Plan Note (Addendum)
BP Readings from Last 3 Encounters:  05/17/19 130/82  02/08/19 140/78  11/22/18 128/82   Blood pressures been consistently greater than 120/80.  Particularly in setting of DM ,We will start losartan today for HTN control, renal protection.Will likely start asa 81mg  and statin. Labs in one week. Close f/u

## 2019-05-20 ENCOUNTER — Telehealth: Payer: Self-pay | Admitting: Family

## 2019-05-20 NOTE — Telephone Encounter (Signed)
Call pt Spoke with pharm D Catie who will be calling him For [place], she thinks we can get the cost way down with the below. Please advise to call us if price is not reduced DRASTICALLY as it had cost him $120/ 30 day supply [place] which is not acceptable. Ask hm NOT to pay unless cost MUCH improved. We can find another medication if we need too  From catie  direct him to the [facility] website for a copay card.   google "Januvia copay card" or "Ozempic copay card" and the discount cards are available on the drug website. Please print and mail to patient.

## 2019-05-20 NOTE — Chronic Care Management (AMB) (Signed)
  Care Management   Outreach Note  05/20/2019 Name: Joseph Davis MRN: [id number] DOB: 1970-11-24  Referred by: Allegra Grana, FNP Reason for referral : Care Management (CM Initial outreach unsuccessful)   An unsuccessful telephone outreach was attempted today. The patient was referred to the case management team for assistance with care management and care coordination.   Follow Up Plan: A HIPPA compliant phone message was left for the patient providing contact information and requesting a return call.  The care management team will reach out to the patient again over the next 7 days.  If patient returns call to provider office, please advise to call Embedded Care Management Care Guide Elisha Ponder LPN at [phone]  Elisha Ponder, LPN Health Advisor, Embedded Care Coordination [facility] Health Care Management ??[email or web address] ??[phone]

## 2019-05-22 NOTE — Telephone Encounter (Signed)
I called patient's mom & let know to please pick up copay card tomorrow when he comes for labs.

## 2019-05-22 NOTE — Telephone Encounter (Signed)
I just downloaded card for him, and printed to the printer at front desk with instructions to have for him to pick up for lab appointment tomorrow.   Sharyl Nimrod - could you please make sure the card printed and is in folders?  Maralyn Sago- could you call patient's mom back to let them know this? Thanks!

## 2019-05-22 NOTE — Telephone Encounter (Signed)
Can you try to google per caties advice and print?  We can mail to pt

## 2019-05-22 NOTE — Telephone Encounter (Signed)
noted 

## 2019-05-22 NOTE — Telephone Encounter (Signed)
Pt's mom said that she did not want to pursue referral now due to coordinating her son's third shift schedule as well as appointments. I asked that they consider & let us know if they change their mind.

## 2019-05-22 NOTE — Telephone Encounter (Signed)
I called and spoke with patient's mom. Unfortunately they have no way at this time of doing this. Pt's mom said they do not have a computer or Internet, never have. Any other suggestions for patient?

## 2019-05-23 ENCOUNTER — Other Ambulatory Visit: Payer: Self-pay

## 2019-05-23 ENCOUNTER — Other Ambulatory Visit (INDEPENDENT_AMBULATORY_CARE_PROVIDER_SITE_OTHER): Payer: BC Managed Care – PPO

## 2019-05-23 DIAGNOSIS — E1165 Type 2 diabetes mellitus with hyperglycemia: Secondary | ICD-10-CM | POA: Diagnosis not present

## 2019-05-23 DIAGNOSIS — Z125 Encounter for screening for malignant neoplasm of prostate: Secondary | ICD-10-CM

## 2019-05-23 DIAGNOSIS — E559 Vitamin D deficiency, unspecified: Secondary | ICD-10-CM | POA: Diagnosis not present

## 2019-05-24 ENCOUNTER — Telehealth: Payer: Self-pay | Admitting: *Deleted

## 2019-05-24 LAB — COMPREHENSIVE METABOLIC PANEL
ALT: 20 U/L (ref 0–53)
AST: 33 U/L (ref 0–37)
Albumin: 4.4 g/dL (ref 3.5–5.2)
Alkaline Phosphatase: 71 U/L (ref 39–117)
BUN: 19 mg/dL (ref 6–23)
CO2: 29 mEq/L (ref 19–32)
Calcium: 9.7 mg/dL (ref 8.4–10.5)
Chloride: 102 mEq/L (ref 96–112)
Creatinine, Ser: 0.97 mg/dL (ref 0.40–1.50)
GFR: 81.85 mL/min (ref >60.00)
Glucose, Bld: 89 mg/dL (ref 70–99)
Potassium: 3.7 mEq/L (ref 3.5–5.1)
Sodium: 140 mEq/L (ref 135–145)
Total Bilirubin: 0.4 mg/dL (ref 0.2–1.2)
Total Protein: 7.6 g/dL (ref 6.0–8.3)

## 2019-05-24 LAB — LIPID PANEL
Cholesterol: 146 mg/dL (ref 0–200)
HDL: 40.3 mg/dL (ref >39.00)
LDL Cholesterol: 95 mg/dL (ref 0–99)
NonHDL: 105.45
Total CHOL/HDL Ratio: 4
Triglycerides: 51 mg/dL (ref 0.0–149.0)
VLDL: 10.2 mg/dL (ref 0.0–40.0)

## 2019-05-24 LAB — VITAMIN D 25 HYDROXY (VIT D DEFICIENCY, FRACTURES): VITD: 24.78 ng/mL — ABNORMAL LOW (ref 30.00–100.00)

## 2019-05-24 LAB — MICROALBUMIN / CREATININE URINE RATIO
Creatinine,U: 93.3 mg/dL
Microalb Creat Ratio: 0.8 mg/g (ref 0.0–30.0)
Microalb, Ur: 0.8 mg/dL (ref 0.0–1.9)

## 2019-05-24 LAB — HEMOGLOBIN A1C: Hgb A1c MFr Bld: 6.2 % (ref 4.6–6.5)

## 2019-05-24 NOTE — Addendum Note (Signed)
Addended by: Warden Fillers on: 05/24/2019 09:32 AM   Modules accepted: Orders

## 2019-05-24 NOTE — Telephone Encounter (Signed)
PSA can not be ran. Wrong tube was drawn. Pt will need to be redrawn if still needed. Please let him know when he is called for his other lab results.

## 2019-05-25 ENCOUNTER — Telehealth: Payer: Self-pay | Admitting: Family

## 2019-05-25 ENCOUNTER — Other Ambulatory Visit: Payer: Self-pay | Admitting: Family

## 2019-05-25 DIAGNOSIS — I10 Essential (primary) hypertension: Secondary | ICD-10-CM

## 2019-05-26 NOTE — Telephone Encounter (Signed)
close

## 2019-05-27 ENCOUNTER — Other Ambulatory Visit: Payer: Self-pay

## 2019-05-27 MED ORDER — ROSUVASTATIN CALCIUM 10 MG PO TABS: 10.0000 mg | ORAL_TABLET | Freq: Every day | ORAL | 1 refills | Status: DC

## 2019-05-27 NOTE — Chronic Care Management (AMB) (Signed)
  Care Management   Outreach Note  05/27/2019 Name: Joseph Davis MRN: [id number] DOB: Oct 31, 1970  Referred by: Allegra Grana, FNP Reason for referral : Care Management (CM Initial outreach unsuccessful) and Chronic Care Management (2nd CM Initial outreach unsuccessful)   A second unsuccessful telephone outreach was attempted today. The patient was referred to the case management team for assistance with care management and care coordination.   Follow Up Plan: A HIPPA compliant phone message was left for the patient providing contact information and requesting a return call.  The care management team will reach out to the patient again over the next 7 days.  If patient returns call to provider office, please advise to call Embedded Care Management Care Guide Elisha Ponder LPN at [phone]  Elisha Ponder, LPN Health Advisor, Embedded Care Coordination [facility] Health Care Management ??[email or web address] ??[phone]

## 2019-06-03 NOTE — Chronic Care Management (AMB) (Signed)
  Care Management   Note  06/03/2019 Name: Joseph Davis MRN: [id number] DOB: Jul 08, 1970  Joseph Davis is a 49 y.o. year old male who is a primary care patient of Allegra Grana, FNP. I reached out to Rushie Chestnut by phone today in response to a referral sent by Joseph Davis health plan.    [name] was given information about care management services today including:  1. Care management services include personalized support from designated clinical staff supervised by his physician, including individualized plan of care and coordination with other care providers 2. 24/7 contact phone numbers for assistance for urgent and routine care needs. 3. The patient may stop care management services at any time by phone call to the office staff.  Patient agreed to services and verbal consent obtained.   Follow up plan: Telephone appointment with care management team member scheduled for:07/11/2019  Elisha Ponder, LPN Health Advisor, Embedded Care Coordination [facility] Health Care Management ??[email or web address] ??[phone]

## 2019-07-05 ENCOUNTER — Ambulatory Visit: Payer: BC Managed Care – PPO | Admitting: Family

## 2019-07-10 ENCOUNTER — Encounter: Payer: Self-pay | Admitting: Family

## 2019-07-10 ENCOUNTER — Other Ambulatory Visit: Payer: Self-pay

## 2019-07-10 ENCOUNTER — Ambulatory Visit (INDEPENDENT_AMBULATORY_CARE_PROVIDER_SITE_OTHER): Payer: BC Managed Care – PPO | Admitting: Family

## 2019-07-10 DIAGNOSIS — E1165 Type 2 diabetes mellitus with hyperglycemia: Secondary | ICD-10-CM

## 2019-07-10 DIAGNOSIS — R079 Chest pain, unspecified: Secondary | ICD-10-CM

## 2019-07-10 DIAGNOSIS — I10 Essential (primary) hypertension: Secondary | ICD-10-CM

## 2019-07-10 DIAGNOSIS — E785 Hyperlipidemia, unspecified: Secondary | ICD-10-CM | POA: Diagnosis not present

## 2019-07-10 LAB — COMPREHENSIVE METABOLIC PANEL
ALT: 28 U/L (ref 0–53)
AST: 34 U/L (ref 0–37)
Albumin: 4.7 g/dL (ref 3.5–5.2)
Alkaline Phosphatase: 64 U/L (ref 39–117)
BUN: 16 mg/dL (ref 6–23)
CO2: 31 mEq/L (ref 19–32)
Calcium: 9.7 mg/dL (ref 8.4–10.5)
Chloride: 101 mEq/L (ref 96–112)
Creatinine, Ser: 0.93 mg/dL (ref 0.40–1.50)
GFR: 85.89 mL/min (ref >60.00)
Glucose, Bld: 84 mg/dL (ref 70–99)
Potassium: 3.7 mEq/L (ref 3.5–5.1)
Sodium: 139 mEq/L (ref 135–145)
Total Bilirubin: 0.5 mg/dL (ref 0.2–1.2)
Total Protein: 7.4 g/dL (ref 6.0–8.3)

## 2019-07-10 LAB — PSA: PSA: 0.43 ng/mL (ref 0.10–4.00)

## 2019-07-10 NOTE — Assessment & Plan Note (Signed)
Compliant with crestor. Will continue

## 2019-07-10 NOTE — Assessment & Plan Note (Signed)
Patient didn't start losartan. Normotensive today. a1c 6.3. Patient prefers not to start ARB for renal protection unless blood pressure increases.

## 2019-07-10 NOTE — Progress Notes (Signed)
Subjective:    Patient ID: Joseph Davis, male    DOB: 1971-03-16, 49 y.o.   MRN: [id number]  CC: Joseph Davis is a 49 y.o. male who presents today for follow up.   HPI: Follow up.  Accompanied by mother. Feels well today, no concerns.    HTN- Not on losartan Taking aspirin  Chest pain resolved. No further episodes.  No shortness of breath, palpitations  DM- on januvia, metformin 1029m BID.  Fasting blood sugars typically less than 120, postprandial at dinner less than 180.  No hypoglycemic episodes  Low end b12- has started otc b12.   No trouble urinating, decreased urine stream.         HISTORY:  Past Medical History:  Diagnosis Date  . Bell's palsy   . Stroke ([facility]    History reviewed. No pertinent surgical history. Family History  Problem Relation Age of Onset  . Diabetes Mother   . Diabetes Father     Allergies: Patient has no known allergies. Current Outpatient Medications on File Prior to Visit  Medication Sig Dispense Refill  . aspirin EC 81 MG tablet Take 81 mg by mouth daily.    . blood glucose meter kit and supplies Dispense based on patient and insurance preference. Use up to four times daily as directed. (FOR ICD-10 E10.9, E11.9).  Check blood sugar up to 4 times per day. 1 each 0  . Ergocalciferol 50 MCG (2000 UT) CAPS Take 1 capsule by mouth daily. 30 capsule 4  . metFORMIN (GLUCOPHAGE) 1000 MG tablet Start with 1000 mg (1 tablet) daily in the AM for 7 days. Then, increase to 1 tablet two times per day. 180 tablet 3  . rosuvastatin (CRESTOR) 10 MG tablet Take 1 tablet (10 mg total) by mouth daily. 90 tablet 1  . vitamin B-12 (V-R VITAMIN B-12) 500 MCG tablet Take 1 tablet (500 mcg total) by mouth daily. 30 tablet 4   No current facility-administered medications on file prior to visit.    Social History   Tobacco Use  . Smoking status: Never Smoker  . Smokeless tobacco: Never Used  Substance Use Topics  . Alcohol use:  Never  . Drug use: Never    Review of Systems  Constitutional: Negative for chills and fever.  Respiratory: Negative for cough.   Cardiovascular: Negative for chest pain (resolved) and palpitations.  Gastrointestinal: Negative for nausea and vomiting.  Genitourinary: Negative for decreased urine volume and difficulty urinating.  Neurological: Negative for dizziness and headaches.      Objective:    BP 120/78   Pulse 80   Temp 97.9 F (36.6 C)   Ht 5' 9.5" (1.765 m)   Wt 187 lb 9.6 oz (85.1 kg)   SpO2 97%   BMI 27.31 kg/m  BP Readings from Last 3 Encounters:  07/10/19 120/78  05/17/19 130/82  02/08/19 140/78   Wt Readings from Last 3 Encounters:  07/10/19 187 lb 9.6 oz (85.1 kg)  05/17/19 185 lb 9.6 oz (84.2 kg)  02/08/19 182 lb (82.6 kg)    Physical Exam Vitals reviewed.  Constitutional:      Appearance: He is well-developed.  Cardiovascular:     Rate and Rhythm: Regular rhythm.     Heart sounds: Normal heart sounds.  Pulmonary:     Effort: Pulmonary effort is normal. No respiratory distress.     Breath sounds: Normal breath sounds. No wheezing, rhonchi or rales.  Skin:    General: Skin is  warm and dry.  Neurological:     Mental Status: He is alert.  Psychiatric:        Speech: Speech normal.        Behavior: Behavior normal.        Assessment & Plan:   Problem List Items Addressed This Visit      Cardiovascular and Mediastinum   HTN (hypertension)    Patient didn't start losartan. Normotensive today. a1c 6.3. Patient prefers not to start ARB for renal protection unless blood pressure increases.      Relevant Medications   aspirin EC 81 MG tablet     Endocrine   Type 2 diabetes mellitus with hyperglycemia, without long-term current use of insulin ([place])    Very well controlled. Deescalate regimen. Stop [place]. He will stay on metformin. F/u 3 months      Relevant Medications   aspirin EC 81 MG tablet     Other   Chest pain    Resolved. No  further episodes.       HLD (hyperlipidemia)    Compliant with crestor. Will continue      Relevant Medications   aspirin EC 81 MG tablet       I have discontinued Georgianne Fick. [place]'s sitaGLIPtin. I am also having him maintain his blood glucose meter kit and supplies, metFORMIN, Ergocalciferol, vitamin B-12, rosuvastatin, and aspirin EC.   No orders of the defined types were placed in this encounter.   Return precautions given.   Risks, benefits, and alternatives of the medications and treatment plan prescribed today were discussed, and patient expressed understanding.   Education regarding symptom management and diagnosis given to patient on AVS.  Continue to follow with Burnard Hawthorne, FNP for routine health maintenance.   [name] and I agreed with plan.   [name], FNP

## 2019-07-10 NOTE — Patient Instructions (Addendum)
I advise that you call to schedule your colonoscopy. Please call the below:  [facility] [address] [place], [place] [address] [phone]  STOP JANUVIA  Fasting blood sugar goal less than 120 2 hour post supper goal is less than 180.   Stay safe!

## 2019-07-10 NOTE — Assessment & Plan Note (Signed)
Very well controlled. Deescalate regimen. Stop [place]. He will stay on metformin. F/u 3 months

## 2019-07-10 NOTE — Assessment & Plan Note (Signed)
Resolved. No further episodes.

## 2019-07-11 ENCOUNTER — Ambulatory Visit: Payer: Self-pay | Admitting: Pharmacist

## 2019-07-11 ENCOUNTER — Telehealth: Payer: BC Managed Care – PPO

## 2019-07-11 NOTE — Chronic Care Management (AMB) (Signed)
  Chronic Care Management   Note  07/11/2019 Name: DAVIE CLAUD MRN: [id number] DOB: 11-25-1970  KARIM AIELLO is a 49 y.o. year old male who is a primary care patient of Allegra Grana, FNP. The CCM team was consulted for assistance with chronic disease management and care coordination needs.    Attempted to contact patient for medication management review and medication access needs. However, appears Januvia cost was resolved, medication was discontinued as it was not needed.    Left HIPAA compliant message for patient to return my call at their convenience and if they still have cost concerns or would like to review medications with a pharmacist.    Plan: - Will await return call from patient if still interested in CCM service  Catie Feliz Beam, PharmD, [place], CPP Clinical Pharmacist [facility] [place] [employer] [phone]

## 2019-07-18 ENCOUNTER — Ambulatory Visit: Payer: Self-pay | Admitting: Pharmacist

## 2019-07-18 NOTE — Chronic Care Management (AMB) (Signed)
  Chronic Care Management   Note  07/18/2019 Name: Joseph Davis MRN: [id number] DOB: [birth year]  Joseph Davis is a 49 y.o. year old male who is a primary care patient of Allegra Grana, FNP. The CCM team was consulted for assistance with chronic disease management and care coordination needs.    Closing case d/t lack of engagement. I will be happy to reopen in the future if patient is interested.   Catie Feliz Beam, PharmD, [place], CPP Clinical Pharmacist [facility] [place] [employer] [phone]

## 2019-10-09 ENCOUNTER — Other Ambulatory Visit: Payer: Self-pay

## 2019-10-09 ENCOUNTER — Encounter: Payer: Self-pay | Admitting: Family

## 2019-10-09 ENCOUNTER — Ambulatory Visit (INDEPENDENT_AMBULATORY_CARE_PROVIDER_SITE_OTHER): Payer: BC Managed Care – PPO | Admitting: Family

## 2019-10-09 VITALS — BP 142/98 | HR 78 | Temp 98.2°F | Ht 69.49 in | Wt 186.8 lb

## 2019-10-09 DIAGNOSIS — E538 Deficiency of other specified B group vitamins: Secondary | ICD-10-CM | POA: Diagnosis not present

## 2019-10-09 DIAGNOSIS — I10 Essential (primary) hypertension: Secondary | ICD-10-CM

## 2019-10-09 DIAGNOSIS — E785 Hyperlipidemia, unspecified: Secondary | ICD-10-CM | POA: Diagnosis not present

## 2019-10-09 DIAGNOSIS — E1165 Type 2 diabetes mellitus with hyperglycemia: Secondary | ICD-10-CM

## 2019-10-09 MED ORDER — LOSARTAN POTASSIUM 50 MG PO TABS: 50.0000 mg | ORAL_TABLET | Freq: Every day | ORAL | 3 refills | Status: DC

## 2019-10-09 NOTE — Assessment & Plan Note (Signed)
Anticipate controlled. Pending lipid panel

## 2019-10-09 NOTE — Patient Instructions (Signed)
Start losartan Labs in one week It is imperative that you are seen AT least twice per year for labs and monitoring. Monitor blood pressure at home and me 5-6 reading on separate days. Goal is less than 120/80, based on newest guidelines, however we certainly want to be less than 130/80;  if persistently higher, please make sooner follow up appointment so we can recheck you blood pressure and manage/ adjust medications.   Managing Your Hypertension Hypertension is commonly called high blood pressure. This is when the force of your blood pressing against the walls of your arteries is too strong. Arteries are blood vessels that carry blood from your heart throughout your body. Hypertension forces the heart to work harder to pump blood, and may cause the arteries to become narrow or stiff. Having untreated or uncontrolled hypertension can cause heart attack, stroke, kidney disease, and other problems. What are blood pressure readings? A blood pressure reading consists of a higher number over a lower number. Ideally, your blood pressure should be below 120/80. The first ("top") number is called the systolic pressure. It is a measure of the pressure in your arteries as your heart beats. The second ("bottom") number is called the diastolic pressure. It is a measure of the pressure in your arteries as the heart relaxes. What does my blood pressure reading mean? Blood pressure is classified into four stages. Based on your blood pressure reading, your health care provider may use the following stages to determine what type of treatment you need, if any. Systolic pressure and diastolic pressure are measured in a unit called mm Hg. Normal  Systolic pressure: below 120.  Diastolic pressure: below 80. Elevated  Systolic pressure: 120-129.  Diastolic pressure: below 80. Hypertension stage 1  Systolic pressure: 130-139.  Diastolic pressure: 80-89. Hypertension stage 2  Systolic pressure: 140 or  above.  Diastolic pressure: 90 or above. What health risks are associated with hypertension? Managing your hypertension is an important responsibility. Uncontrolled hypertension can lead to:  A heart attack.  A stroke.  A weakened blood vessel (aneurysm).  Heart failure.  Kidney damage.  Eye damage.  Metabolic syndrome.  Memory and concentration problems. What changes can I make to manage my hypertension? Hypertension can be managed by making lifestyle changes and possibly by taking medicines. Your health care provider will help you make a plan to bring your blood pressure within a normal range. Eating and drinking   Eat a diet that is high in fiber and potassium, and low in salt (sodium), added sugar, and fat. An example eating plan is called the DASH (Dietary Approaches to Stop Hypertension) diet. To eat this way: ? Eat plenty of fresh fruits and vegetables. Try to fill half of your plate at each meal with fruits and vegetables. ? Eat whole grains, such as whole wheat pasta, brown rice, or whole grain bread. Fill about one quarter of your plate with whole grains. ? Eat low-fat diary products. ? Avoid fatty cuts of meat, processed or cured meats, and poultry with skin. Fill about one quarter of your plate with lean proteins such as fish, chicken without skin, beans, eggs, and tofu. ? Avoid premade and processed foods. These tend to be higher in sodium, added sugar, and fat.  Reduce your daily sodium intake. Most people with hypertension should eat less than 1,500 mg of sodium a day.  Limit alcohol intake to no more than 1 drink a day for nonpregnant women and 2 drinks a day for men.  One drink equals 12 oz of beer, 5 oz of wine, or 1 oz of hard liquor. Lifestyle  Work with your health care provider to maintain a healthy body weight, or to lose weight. Ask what an ideal weight is for you.  Get at least 30 minutes of exercise that causes your heart to beat faster (aerobic  exercise) most days of the week. Activities may include walking, swimming, or biking.  Include exercise to strengthen your muscles (resistance exercise), such as weight lifting, as part of your weekly exercise routine. Try to do these types of exercises for 30 minutes at least 3 days a week.  Do not use any products that contain nicotine or tobacco, such as cigarettes and e-cigarettes. If you need help quitting, ask your health care provider.  Control any long-term (chronic) conditions you have, such as high cholesterol or diabetes. Monitoring  Monitor your blood pressure at home as told by your health care provider. Your personal target blood pressure may vary depending on your medical conditions, your age, and other factors.  Have your blood pressure checked regularly, as often as told by your health care provider. Working with your health care provider  Review all the medicines you take with your health care provider because there may be side effects or interactions.  Talk with your health care provider about your diet, exercise habits, and other lifestyle factors that may be contributing to hypertension.  Visit your health care provider regularly. Your health care provider can help you create and adjust your plan for managing hypertension. Will I need medicine to control my blood pressure? Your health care provider may prescribe medicine if lifestyle changes are not enough to get your blood pressure under control, and if:  Your systolic blood pressure is 130 or higher.  Your diastolic blood pressure is 80 or higher. Take medicines only as told by your health care provider. Follow the directions carefully. Blood pressure medicines must be taken as prescribed. The medicine does not work as well when you skip doses. Skipping doses also puts you at risk for problems. Contact a health care provider if:  You think you are having a reaction to medicines you have taken.  You have repeated  (recurrent) headaches.  You feel dizzy.  You have swelling in your ankles.  You have trouble with your vision. Get help right away if:  You develop a severe headache or confusion.  You have unusual weakness or numbness, or you feel faint.  You have severe pain in your chest or abdomen.  You vomit repeatedly.  You have trouble breathing. Summary  Hypertension is when the force of blood pumping through your arteries is too strong. If this condition is not controlled, it may put you at risk for serious complications.  Your personal target blood pressure may vary depending on your medical conditions, your age, and other factors. For most people, a normal blood pressure is less than 120/80.  Hypertension is managed by lifestyle changes, medicines, or both. Lifestyle changes include weight loss, eating a healthy, low-sodium diet, exercising more, and limiting alcohol. This information is not intended to replace advice given to you by your health care provider. Make sure you discuss any questions you have with your health care provider. Document Revised: 07/20/2018 Document Reviewed: 02/24/2016 Elsevier Patient Education  [place].

## 2019-10-09 NOTE — Progress Notes (Signed)
Subjective:    Patient ID: Joseph Davis, male    DOB: 1971-01-27, 49 y.o.   MRN: [id number]  CC: Joseph Davis is a 49 y.o. male who presents today for follow up.   HPI: Accompanied by mother today  Feels well today  NO complaints  HTN- not on any medication. Never been on medication for blood pressure.  No cp, sob, fatigue.   DM- compliant with januvia, metformin  HLD- tolerating crestor.   b12 deficiency- taking  PO b12 Vitamin d deficiency- taking vitamin d 2000 units   LDL 95 Due colonoscopy - referred but not called  Declines hep c screen  HISTORY:  Past Medical History:  Diagnosis Date  . Bell's palsy   . Stroke [facility])    No past surgical history on file. Family History  Problem Relation Age of Onset  . Diabetes Mother   . Diabetes Father     Allergies: Patient has no known allergies. Current Outpatient Medications on File Prior to Visit  Medication Sig Dispense Refill  . aspirin EC 81 MG tablet Take 81 mg by mouth daily.    . blood glucose meter kit and supplies Dispense based on patient and insurance preference. Use up to four times daily as directed. (FOR ICD-10 E10.9, E11.9).  Check blood sugar up to 4 times per day. 1 each 0  . Ergocalciferol 50 MCG (2000 UT) CAPS Take 1 capsule by mouth daily. 30 capsule 4  . JANUVIA 50 MG tablet Take 50 mg by mouth daily.    . metFORMIN (GLUCOPHAGE) 1000 MG tablet Start with 1000 mg (1 tablet) daily in the AM for 7 days. Then, increase to 1 tablet two times per day. 180 tablet 3  . rosuvastatin (CRESTOR) 10 MG tablet Take 1 tablet (10 mg total) by mouth daily. 90 tablet 1  . vitamin B-12 (V-R VITAMIN B-12) 500 MCG tablet Take 1 tablet (500 mcg total) by mouth daily. 30 tablet 4   No current facility-administered medications on file prior to visit.    Social History   Tobacco Use  . Smoking status: Never Smoker  . Smokeless tobacco: Never Used  Vaping Use  . Vaping Use: Never used    Substance Use Topics  . Alcohol use: Never  . Drug use: Never    Review of Systems  Constitutional: Negative for chills and fever.  Respiratory: Negative for cough.   Cardiovascular: Negative for chest pain and palpitations.  Gastrointestinal: Negative for nausea and vomiting.      Objective:    BP (!) 142/98 (BP Location: Left Arm, Patient Position: Sitting)   Pulse 78   Temp 98.2 F (36.8 C)   Ht 5' 9.49" (1.765 m)   Wt 186 lb 12.8 oz (84.7 kg)   SpO2 97%   BMI 27.20 kg/m  BP Readings from Last 3 Encounters:  10/09/19 (!) 142/98  07/10/19 120/78  05/17/19 130/82   Wt Readings from Last 3 Encounters:  10/09/19 186 lb 12.8 oz (84.7 kg)  07/10/19 187 lb 9.6 oz (85.1 kg)  05/17/19 185 lb 9.6 oz (84.2 kg)    Physical Exam Vitals reviewed.  Constitutional:      Appearance: He is well-developed.  Cardiovascular:     Rate and Rhythm: Regular rhythm.     Heart sounds: Normal heart sounds.  Pulmonary:     Effort: Pulmonary effort is normal. No respiratory distress.     Breath sounds: Normal breath sounds. No wheezing, rhonchi or rales.  Skin:    General: Skin is warm and dry.  Neurological:     Mental Status: He is alert.  Psychiatric:        Speech: Speech normal.        Behavior: Behavior normal.        Assessment & Plan:   Problem List Items Addressed This Visit      Cardiovascular and Mediastinum   HTN (hypertension) - Primary    Elevated . Start losartan , particularly in setting of DM, h/o cva. Labs in one week      Relevant Medications   losartan (COZAAR) 50 MG tablet   Other Relevant Orders   Comprehensive metabolic panel     Endocrine   Type 2 diabetes mellitus with hyperglycemia, without long-term current use of insulin (HCC)    Controlled. Continue regimen      Relevant Medications   JANUVIA 50 MG tablet   losartan (COZAAR) 50 MG tablet   Other Relevant Orders   Hemoglobin A1c   Lipid panel     Other   B12 deficiency   Relevant  Orders   VITAMIN D 25 Hydroxy (Vit-D Deficiency, Fractures)   B12 and Folate Panel   HLD (hyperlipidemia)    Anticipate controlled. Pending lipid panel      Relevant Medications   losartan (COZAAR) 50 MG tablet   Other Relevant Orders   Lipid panel       I am having Joseph Davis start on losartan. I am also having him maintain his blood glucose meter kit and supplies, metFORMIN, Ergocalciferol, vitamin B-12, rosuvastatin, aspirin EC, and Januvia.   Meds ordered this encounter  Medications  . losartan (COZAAR) 50 MG tablet    Sig: Take 1 tablet (50 mg total) by mouth daily.    Dispense:  90 tablet    Refill:  3    Order Specific Question:   Supervising Provider    Answer:   Joseph Davis [[id number]]    Return precautions given.   Risks, benefits, and alternatives of the medications and treatment plan prescribed today were discussed, and patient expressed understanding.   Education regarding symptom management and diagnosis given to patient on AVS.  Continue to follow with Joseph Hawthorne, FNP for routine health maintenance.   Joseph Davis and I agreed with plan.   Joseph Paris, FNP

## 2019-10-09 NOTE — Assessment & Plan Note (Signed)
Elevated . Start losartan , particularly in setting of DM, h/o cva. Labs in one week

## 2019-10-09 NOTE — Assessment & Plan Note (Signed)
Controlled. Continue regimen 

## 2019-10-16 ENCOUNTER — Other Ambulatory Visit: Payer: Self-pay

## 2019-10-16 ENCOUNTER — Other Ambulatory Visit (INDEPENDENT_AMBULATORY_CARE_PROVIDER_SITE_OTHER): Payer: BC Managed Care – PPO

## 2019-10-16 DIAGNOSIS — E538 Deficiency of other specified B group vitamins: Secondary | ICD-10-CM | POA: Diagnosis not present

## 2019-10-16 DIAGNOSIS — E1165 Type 2 diabetes mellitus with hyperglycemia: Secondary | ICD-10-CM | POA: Diagnosis not present

## 2019-10-16 DIAGNOSIS — E785 Hyperlipidemia, unspecified: Secondary | ICD-10-CM

## 2019-10-16 DIAGNOSIS — I10 Essential (primary) hypertension: Secondary | ICD-10-CM

## 2019-10-16 LAB — B12 AND FOLATE PANEL
Folate: 23 ng/mL (ref >5.9)
Vitamin B-12: 771 pg/mL (ref 211–911)

## 2019-10-16 LAB — LIPID PANEL
Cholesterol: 86 mg/dL (ref 0–200)
HDL: 37.1 mg/dL — ABNORMAL LOW (ref >39.00)
LDL Cholesterol: 41 mg/dL (ref 0–99)
NonHDL: 49.14
Total CHOL/HDL Ratio: 2
Triglycerides: 43 mg/dL (ref 0.0–149.0)
VLDL: 8.6 mg/dL (ref 0.0–40.0)

## 2019-10-16 LAB — COMPREHENSIVE METABOLIC PANEL
ALT: 25 U/L (ref 0–53)
AST: 34 U/L (ref 0–37)
Albumin: 4.8 g/dL (ref 3.5–5.2)
Alkaline Phosphatase: 51 U/L (ref 39–117)
BUN: 19 mg/dL (ref 6–23)
CO2: 29 mEq/L (ref 19–32)
Calcium: 9.7 mg/dL (ref 8.4–10.5)
Chloride: 99 mEq/L (ref 96–112)
Creatinine, Ser: 0.93 mg/dL (ref 0.40–1.50)
GFR: 85.79 mL/min (ref >60.00)
Glucose, Bld: 76 mg/dL (ref 70–99)
Potassium: 4.3 mEq/L (ref 3.5–5.1)
Sodium: 138 mEq/L (ref 135–145)
Total Bilirubin: 0.8 mg/dL (ref 0.2–1.2)
Total Protein: 7.4 g/dL (ref 6.0–8.3)

## 2019-10-16 LAB — HEMOGLOBIN A1C: Hgb A1c MFr Bld: 6.5 % (ref 4.6–6.5)

## 2019-10-16 LAB — VITAMIN D 25 HYDROXY (VIT D DEFICIENCY, FRACTURES): VITD: 43.73 ng/mL (ref 30.00–100.00)

## 2019-10-21 ENCOUNTER — Telehealth: Payer: Self-pay | Admitting: Family

## 2019-10-21 NOTE — Telephone Encounter (Signed)
Patient called about his lab results  

## 2019-10-23 NOTE — Telephone Encounter (Signed)
Called and spoke to patients mother on 10/21/19 to give the lab results for Rushie Chestnut. I gave full lab results and Wanda Plump had no questions and verbalized full understanding. Bonita Quin came in on 10/23/19 accompanied by her husband and told Clydie Braun at front desk that she did not speak to anyone about her sons labs and that her son was deeply concerned about his low cholesterol and had a ton of questions about why it was low. I spoke to Newby mother again about his labs and explained that we were the ones who spoke on the phone on Monday. Bonita Quin questioned why his labs were low and why the labs were needed to monitor his cholesterol when he is given medication, can he stop the medication, why was his B12 is low, and why are his labs abnormal. I explained that his medication is used to help control his cholesterol along with healthy choices and lifestyle, and that's why Claris Che recommended he eat more healthy fats such as avocados, nuts, and beans to elevate his HDL. Noted that his HDL results came out to 37 and that was below the >39 from his recent labs. I informed them to not stop any medication and again informed them that Claris Che told them to continue their regimen when it came to A1c and Cholesterol. I reread the lab results again and informed them that the B12 was not noted to be abnormal. I explained that lab work is used to help [place] properly monitor his levels. Bonita Quin had no further questions and verbalized understanding Again.

## 2019-10-29 ENCOUNTER — Telehealth: Payer: Self-pay | Admitting: Family

## 2019-10-29 MED ORDER — JANUVIA 50 MG PO TABS: 50.0000 mg | ORAL_TABLET | Freq: Every day | ORAL | 1 refills | Status: DC

## 2019-10-29 NOTE — Telephone Encounter (Signed)
Patient needs a refill on his JANUVIA 50 MG tablet.

## 2019-10-30 LAB — HM DIABETES EYE EXAM

## 2019-11-04 ENCOUNTER — Encounter: Payer: Self-pay | Admitting: Family

## 2019-12-26 ENCOUNTER — Other Ambulatory Visit: Payer: Self-pay | Admitting: Family

## 2020-01-10 ENCOUNTER — Ambulatory Visit: Payer: BC Managed Care – PPO | Admitting: Family

## 2020-01-13 ENCOUNTER — Other Ambulatory Visit: Payer: Self-pay

## 2020-01-13 ENCOUNTER — Ambulatory Visit (INDEPENDENT_AMBULATORY_CARE_PROVIDER_SITE_OTHER): Payer: BC Managed Care – PPO | Admitting: Family

## 2020-01-13 ENCOUNTER — Encounter: Payer: Self-pay | Admitting: Family

## 2020-01-13 VITALS — BP 132/92 | HR 77 | Temp 98.4°F | Ht 69.49 in | Wt 195.4 lb

## 2020-01-13 DIAGNOSIS — I10 Essential (primary) hypertension: Secondary | ICD-10-CM

## 2020-01-13 DIAGNOSIS — E785 Hyperlipidemia, unspecified: Secondary | ICD-10-CM

## 2020-01-13 DIAGNOSIS — E1165 Type 2 diabetes mellitus with hyperglycemia: Secondary | ICD-10-CM | POA: Diagnosis not present

## 2020-01-13 DIAGNOSIS — E559 Vitamin D deficiency, unspecified: Secondary | ICD-10-CM | POA: Diagnosis not present

## 2020-01-13 DIAGNOSIS — Z23 Encounter for immunization: Secondary | ICD-10-CM

## 2020-01-13 MED ORDER — ERGOCALCIFEROL 50 MCG (2000 UT) PO CAPS: 0.5000 | ORAL_CAPSULE | Freq: Every day | ORAL | 2 refills | Status: DC

## 2020-01-13 MED ORDER — LOSARTAN POTASSIUM 100 MG PO TABS: 100.0000 mg | ORAL_TABLET | Freq: Every day | ORAL | 1 refills | Status: DC

## 2020-01-13 NOTE — Patient Instructions (Addendum)
Start losartan 100mg  once per day; you may use the losartan 50mg  tablets that you have by taking TWO 50mg  tablets for total dose of 100mg  per day until you have used up, then use the 100mg  losartan tablet that I sent in for you.   It is imperative that you are seen AT least twice per year for labs and monitoring. Monitor blood pressure at home and me 5-6 reading on separate days after ONE week on losartan 100mg .   Goal is less than 120/80, based on newest guidelines, however we certainly want to be less than 130/80;  if persistently higher, please make sooner follow up appointment so we can recheck you blood pressure and manage/ adjust medications.   Start 1000 unit vitamin which is half tablet of what you currently have.  Nice to see you!

## 2020-01-13 NOTE — Assessment & Plan Note (Signed)
Suspect improved, well controlled. Likely dc januvia 50mg  once a1c returns. Continue metformin 1000mg  bid

## 2020-01-13 NOTE — Assessment & Plan Note (Signed)
Uncontrolled. Increase losartan to 100mg . CMP one week

## 2020-01-13 NOTE — Assessment & Plan Note (Signed)
Controlled. Continue crestor 10mg 

## 2020-01-13 NOTE — Progress Notes (Signed)
Subjective:    Patient ID: Joseph Davis, male    DOB: 1970-10-29, 49 y.o.   MRN: [id number]  CC: Joseph Davis is a 49 y.o. male who presents today for follow up.   HPI: Accompanied by mother today  Feels well today No complaints  HTN- BP at home, as  Low as 103/70 , up to 140/90.  Compliant with losartan in the afternoon or evening. No cp, sob. Not adding salt to foods.   Dm- compliant with januvia, metformin  HLD- compliant with crestor 8m  Declines colonoscopy, HIV screen  Taking vit D 2000 units daily   HISTORY:  Past Medical History:  Diagnosis Date  . Bell's palsy   . Stroke ([facility]    No past surgical history on file. Family History  Problem Relation Age of Onset  . Diabetes Mother   . Diabetes Father     Allergies: Patient has no known allergies. Current Outpatient Medications on File Prior to Visit  Medication Sig Dispense Refill  . aspirin EC 81 MG tablet Take 81 mg by mouth daily.    . blood glucose meter kit and supplies Dispense based on patient and insurance preference. Use up to four times daily as directed. (FOR ICD-10 E10.9, E11.9).  Check blood sugar up to 4 times per day. 1 each 0  . JANUVIA 50 MG tablet Take 1 tablet (50 mg total) by mouth daily. 90 tablet 1  . metFORMIN (GLUCOPHAGE) 1000 MG tablet Start with 1000 mg (1 tablet) daily in the AM for 7 days. Then, increase to 1 tablet two times per day. 180 tablet 3  . rosuvastatin (CRESTOR) 10 MG tablet Take 1 tablet by mouth once daily 90 tablet 0  . vitamin B-12 (V-R VITAMIN B-12) 500 MCG tablet Take 1 tablet (500 mcg total) by mouth daily. 30 tablet 4   No current facility-administered medications on file prior to visit.    Social History   Tobacco Use  . Smoking status: Never Smoker  . Smokeless tobacco: Never Used  Vaping Use  . Vaping Use: Never used  Substance Use Topics  . Alcohol use: Never  . Drug use: Never    Review of Systems  Constitutional: Negative  for chills and fever.  Respiratory: Negative for cough.   Cardiovascular: Negative for chest pain and palpitations.  Gastrointestinal: Negative for nausea and vomiting.      Objective:    BP (!) 132/92 (BP Location: Left Arm, Patient Position: Sitting)   Pulse 77   Temp 98.4 F (36.9 C)   Ht 5' 9.49" (1.765 m)   Wt 195 lb 6.4 oz (88.6 kg)   SpO2 97%   BMI 28.45 kg/m  BP Readings from Last 3 Encounters:  01/13/20 (!) 132/92  10/09/19 (!) 142/98  07/10/19 120/78   Wt Readings from Last 3 Encounters:  01/13/20 195 lb 6.4 oz (88.6 kg)  10/09/19 186 lb 12.8 oz (84.7 kg)  07/10/19 187 lb 9.6 oz (85.1 kg)    Physical Exam Vitals reviewed.  Constitutional:      Appearance: He is well-developed.  Cardiovascular:     Rate and Rhythm: Regular rhythm.     Heart sounds: Normal heart sounds.  Pulmonary:     Effort: Pulmonary effort is normal. No respiratory distress.     Breath sounds: Normal breath sounds. No wheezing, rhonchi or rales.  Skin:    General: Skin is warm and dry.  Neurological:     Mental Status: He  is alert.  Psychiatric:        Speech: Speech normal.        Behavior: Behavior normal.        Assessment & Plan:   Problem List Items Addressed This Visit      Cardiovascular and Mediastinum   HTN (hypertension)    Uncontrolled. Increase losartan to 148m. CMP one week      Relevant Medications   losartan (COZAAR) 100 MG tablet   Other Relevant Orders   Comprehensive metabolic panel     Endocrine   Type 2 diabetes mellitus with hyperglycemia, without long-term current use of insulin [place]    Suspect improved, well controlled. Likely dc januvia 560monce a1c returns. Continue metformin 100030mid      Relevant Medications   losartan (COZAAR) 100 MG tablet   Other Relevant Orders   Hemoglobin A1c   Microalbumin / creatinine urine ratio     Other   HLD (hyperlipidemia)    Controlled. Continue crestor 53m70m   Relevant Medications   losartan  (COZAAR) 100 MG tablet    Other Visit Diagnoses    Vitamin D deficiency    -  Primary   Relevant Medications   Ergocalciferol 50 MCG (2000 UT) CAPS   Need for immunization against influenza       Relevant Orders   Flu Vaccine QUAD 36+ mos IM (Completed)       I have changed ChriGeorgianne Fickrshall's losartan and Ergocalciferol. I am also having him maintain his blood glucose meter kit and supplies, metFORMIN, vitamin B-12, aspirin EC, Januvia, and rosuvastatin.   Meds ordered this encounter  Medications  . losartan (COZAAR) 100 MG tablet    Sig: Take 1 tablet (100 mg total) by mouth daily.    Dispense:  90 tablet    Refill:  1    Order Specific Question:   Supervising Provider    Answer:   TULLDeborra [id number]]  . Ergocalciferol 50 MCG (2000 UT) CAPS    Sig: Take 0.5 capsules by mouth daily.    Dispense:  60 capsule    Refill:  2    Order Specific Question:   Supervising Provider    Answer:   TULLCrecencio [id number]]    Return precautions given.   Risks, benefits, and alternatives of the medications and treatment plan prescribed today were discussed, and patient expressed understanding.   Education regarding symptom management and diagnosis given to patient on AVS.  Continue to follow with ArneBurnard HawthorneP for routine health maintenance.   ChriIgnacia Palma I agreed with plan.   MargMable ParisP

## 2020-01-20 ENCOUNTER — Other Ambulatory Visit (INDEPENDENT_AMBULATORY_CARE_PROVIDER_SITE_OTHER): Payer: BC Managed Care – PPO

## 2020-01-20 ENCOUNTER — Other Ambulatory Visit: Payer: Self-pay

## 2020-01-20 DIAGNOSIS — E1165 Type 2 diabetes mellitus with hyperglycemia: Secondary | ICD-10-CM

## 2020-01-20 DIAGNOSIS — I10 Essential (primary) hypertension: Secondary | ICD-10-CM | POA: Diagnosis not present

## 2020-01-20 LAB — COMPREHENSIVE METABOLIC PANEL
ALT: 28 U/L (ref 0–53)
AST: 35 U/L (ref 0–37)
Albumin: 4.8 g/dL (ref 3.5–5.2)
Alkaline Phosphatase: 57 U/L (ref 39–117)
BUN: 12 mg/dL (ref 6–23)
CO2: 34 mEq/L — ABNORMAL HIGH (ref 19–32)
Calcium: 10.1 mg/dL (ref 8.4–10.5)
Chloride: 98 mEq/L (ref 96–112)
Creatinine, Ser: 0.98 mg/dL (ref 0.40–1.50)
GFR: 88.98 mL/min (ref >60.00)
Glucose, Bld: 95 mg/dL (ref 70–99)
Potassium: 4.5 mEq/L (ref 3.5–5.1)
Sodium: 138 mEq/L (ref 135–145)
Total Bilirubin: 0.6 mg/dL (ref 0.2–1.2)
Total Protein: 7.7 g/dL (ref 6.0–8.3)

## 2020-01-20 LAB — MICROALBUMIN / CREATININE URINE RATIO
Creatinine,U: 122.2 mg/dL
Microalb Creat Ratio: 1.5 mg/g (ref 0.0–30.0)
Microalb, Ur: 1.8 mg/dL (ref 0.0–1.9)

## 2020-01-20 LAB — HEMOGLOBIN A1C: Hgb A1c MFr Bld: 6.9 % — ABNORMAL HIGH (ref 4.6–6.5)

## 2020-01-22 ENCOUNTER — Telehealth: Payer: Self-pay

## 2020-01-22 NOTE — Telephone Encounter (Signed)
LMTCB for labs. 

## 2020-01-24 ENCOUNTER — Other Ambulatory Visit: Payer: Self-pay | Admitting: Family

## 2020-01-24 ENCOUNTER — Telehealth: Payer: Self-pay

## 2020-01-24 DIAGNOSIS — E1165 Type 2 diabetes mellitus with hyperglycemia: Secondary | ICD-10-CM

## 2020-01-24 MED ORDER — SITAGLIPTIN PHOSPHATE 100 MG PO TABS: 100.0000 mg | ORAL_TABLET | Freq: Every day | ORAL | 1 refills | Status: DC

## 2020-01-24 NOTE — Telephone Encounter (Signed)
Pt called back with questions over his medication and how to take his Januvia and metformin. I reread his result notes to him until he had no further questions. He stated that he needed to write down his instructions and was just calling back to make sure that he had his instructions correct. He inquired about the status of his prescription and I let him know that his prescription was faxed off to the pharmacy due to our current issue with E-prescribing. Joseph Davis verbalized understanding and had no further questions.

## 2020-03-25 ENCOUNTER — Other Ambulatory Visit: Payer: Self-pay | Admitting: Family

## 2020-03-25 NOTE — Telephone Encounter (Signed)
Patient called in for refill for rosuvastatin (CRESTOR) 10 MG tablet

## 2020-04-14 ENCOUNTER — Ambulatory Visit: Payer: BC Managed Care – PPO | Admitting: Family

## 2020-04-20 ENCOUNTER — Encounter: Payer: Self-pay | Admitting: Family

## 2020-04-20 ENCOUNTER — Ambulatory Visit (INDEPENDENT_AMBULATORY_CARE_PROVIDER_SITE_OTHER): Payer: BC Managed Care – PPO | Admitting: Family

## 2020-04-20 ENCOUNTER — Other Ambulatory Visit: Payer: Self-pay

## 2020-04-20 VITALS — BP 118/78 | HR 79 | Temp 98.2°F | Ht 69.49 in | Wt 198.2 lb

## 2020-04-20 DIAGNOSIS — E1165 Type 2 diabetes mellitus with hyperglycemia: Secondary | ICD-10-CM | POA: Diagnosis not present

## 2020-04-20 DIAGNOSIS — E785 Hyperlipidemia, unspecified: Secondary | ICD-10-CM | POA: Diagnosis not present

## 2020-04-20 DIAGNOSIS — Z1211 Encounter for screening for malignant neoplasm of colon: Secondary | ICD-10-CM | POA: Diagnosis not present

## 2020-04-20 DIAGNOSIS — I10 Essential (primary) hypertension: Secondary | ICD-10-CM

## 2020-04-20 NOTE — Assessment & Plan Note (Signed)
Controlled. Continue losartan 100mg. 

## 2020-04-20 NOTE — Progress Notes (Signed)
Subjective:    Patient ID: Joseph Davis, male    DOB: 1970/12/04, 50 y.o.   MRN: [id number]  CC: Joseph Davis is a 50 y.o. male who presents today for follow up.   HPI: Feels well today No complaints  Accompanied by mother today   HTN- compliant with losartan 122m. No cp, sob .  DM- compliant with metformin 10052mBID, januvia 1006m FGB 123, 140 ,194. No numbness in feet  HLD- compliant with crestor 68m58m   HISTORY:  Past Medical History:  Diagnosis Date  . Bell's palsy   . [facility] No pertinent surgical history. Family History  Problem Relation Age of Onset  . Diabetes Mother   . Diabetes Father     Allergies: Patient has no known allergies. Current Outpatient Medications on File Prior to Visit  Medication Sig Dispense Refill  . aspirin EC 81 MG tablet Take 81 mg by mouth daily.    . blood glucose meter kit and supplies Dispense based on patient and insurance preference. Use up to four times daily as directed. (FOR ICD-10 E10.9, E11.9).  Check blood sugar up to 4 times per day. 1 each 0  . Ergocalciferol 50 MCG (2000 UT) CAPS Take 0.5 capsules by mouth daily. 60 capsule 2  . losartan (COZAAR) 100 MG tablet Take 1 tablet (100 mg total) by mouth daily. 90 tablet 1  . metFORMIN (GLUCOPHAGE) 1000 MG tablet Start with 1000 mg (1 tablet) daily in the AM for 7 days. Then, increase to 1 tablet two times per day. 180 tablet 3  . rosuvastatin (CRESTOR) 10 MG tablet Take 1 tablet by mouth once daily 90 tablet 0  . sitaGLIPtin (JANUVIA) 100 MG tablet Take 1 tablet (100 mg total) by mouth daily. 90 tablet 1  . vitamin B-12 (V-R VITAMIN B-12) 500 MCG tablet Take 1 tablet (500 mcg total) by mouth daily. 30 tablet 4   No current facility-administered medications on file prior to visit.    Social History   Tobacco Use  . Smoking status: Never Smoker  . Smokeless tobacco: Never Used  Vaping Use  . Vaping Use: Never used   Substance Use Topics  . Alcohol use: Never  . Drug use: Never    Review of Systems  Constitutional: Negative for chills and fever.  Respiratory: Negative for cough.   Cardiovascular: Negative for chest pain and palpitations.  Gastrointestinal: Negative for nausea and vomiting.      Objective:    BP 118/78   Pulse 79   Temp 98.2 F (36.8 C)   Ht 5' 9.49" (1.765 m)   Wt 198 lb 3.2 oz (89.9 kg)   SpO2 96%   BMI 28.86 kg/m  BP Readings from Last 3 Encounters:  04/20/20 118/78  01/13/20 (!) 132/92  10/09/19 (!) 142/98   Wt Readings from Last 3 Encounters:  04/20/20 198 lb 3.2 oz (89.9 kg)  01/13/20 195 lb 6.4 oz (88.6 kg)  10/09/19 186 lb 12.8 oz (84.7 kg)    Physical Exam Vitals reviewed.  Constitutional:      Appearance: He is well-developed and well-nourished.  Cardiovascular:     Rate and Rhythm: Regular rhythm.     Heart sounds: Normal heart sounds.  Pulmonary:     Effort: Pulmonary effort is normal. No respiratory distress.     Breath sounds: Normal breath sounds. No wheezing, rhonchi or rales.  Skin:    General: Skin  is warm and dry.  Neurological:     Mental Status: He is alert.  Psychiatric:        Mood and Affect: Mood and affect normal.        Speech: Speech normal.        Behavior: Behavior normal.        Assessment & Plan:   Problem List Items Addressed This Visit      Cardiovascular and Mediastinum   HTN (hypertension)    Controlled. Continue losartan 134m        Endocrine   Type 2 diabetes mellitus with hyperglycemia, without long-term current use of insulin ([place] - Primary    Presume uncontrolled. Pending a1c ( one day early than 90 days, he will schedule in 1-2 weeks). Continue metformin 10023mbid, januvia 10034m      Relevant Orders   Hemoglobin A1c     Other   HLD (hyperlipidemia)    Controlled, continue crestor 96m49m    Other Visit Diagnoses    Screen for colon cancer       Relevant Orders   Ambulatory referral  to Gastroenterology       I am having ChriIgnacia Palmantain his blood glucose meter kit and supplies, metFORMIN, vitamin B-12, aspirin EC, losartan, Ergocalciferol, sitaGLIPtin, and rosuvastatin.   No orders of the defined types were placed in this encounter.   Return precautions given.   Risks, benefits, and alternatives of the medications and treatment plan prescribed today were discussed, and patient expressed understanding.   Education regarding symptom management and diagnosis given to patient on AVS.  Continue to follow with ArneBurnard HawthorneP for routine health maintenance.   ChriIgnacia Palma I agreed with plan.   MargMable ParisP

## 2020-04-20 NOTE — Assessment & Plan Note (Signed)
Presume uncontrolled. Pending a1c ( one day early than 90 days, he will schedule in 1-2 weeks). Continue metformin 1000mg  bid, januvia 100mg .

## 2020-04-20 NOTE — Assessment & Plan Note (Signed)
Controlled, continue crestor 10mg 

## 2020-04-20 NOTE — Patient Instructions (Signed)
Referral for colonoscopy Let us know if you dont hear back within a week in regards to an appointment being scheduled.   

## 2020-04-30 ENCOUNTER — Other Ambulatory Visit: Payer: Self-pay | Admitting: Family

## 2020-04-30 ENCOUNTER — Other Ambulatory Visit: Payer: Self-pay

## 2020-04-30 ENCOUNTER — Other Ambulatory Visit (INDEPENDENT_AMBULATORY_CARE_PROVIDER_SITE_OTHER): Payer: BC Managed Care – PPO

## 2020-04-30 DIAGNOSIS — E1165 Type 2 diabetes mellitus with hyperglycemia: Secondary | ICD-10-CM

## 2020-04-30 LAB — HEMOGLOBIN A1C: Hgb A1c MFr Bld: 7 % — ABNORMAL HIGH (ref 4.6–6.5)

## 2020-05-05 ENCOUNTER — Telehealth: Payer: Self-pay

## 2020-05-05 ENCOUNTER — Other Ambulatory Visit: Payer: Self-pay

## 2020-05-05 MED ORDER — EMPAGLIFLOZIN 10 MG PO TABS: 10.0000 mg | ORAL_TABLET | Freq: Every day | ORAL | 0 refills | Status: DC

## 2020-05-05 NOTE — Telephone Encounter (Signed)
LMTCB for results. 

## 2020-06-24 ENCOUNTER — Telehealth: Payer: Self-pay | Admitting: Family

## 2020-06-24 MED ORDER — ROSUVASTATIN CALCIUM 10 MG PO TABS: 10.0000 mg | ORAL_TABLET | Freq: Every day | ORAL | 0 refills | Status: DC

## 2020-06-24 NOTE — Addendum Note (Signed)
Addended by: Elise Benne T on: 06/24/2020 12:49 PM   Modules accepted: Orders

## 2020-06-24 NOTE — Telephone Encounter (Signed)
Pt needs a refill on rosuvastatin (CRESTOR) 10 MG tablet sent to [facility]

## 2020-07-01 ENCOUNTER — Telehealth: Payer: Self-pay | Admitting: Family

## 2020-07-01 DIAGNOSIS — E1165 Type 2 diabetes mellitus with hyperglycemia: Secondary | ICD-10-CM

## 2020-07-01 MED ORDER — METFORMIN HCL 1000 MG PO TABS: ORAL_TABLET | ORAL | 3 refills | Status: DC

## 2020-07-01 NOTE — Telephone Encounter (Signed)
Pt needs a refill on metFORMIN (GLUCOPHAGE) 1000 MG tablet sent to [facility]

## 2020-07-01 NOTE — Addendum Note (Signed)
Addended by: Dennie Bible on: 07/01/2020 11:33 AM   Modules accepted: Orders

## 2020-07-01 NOTE — Telephone Encounter (Signed)
Refill sent.

## 2020-07-22 ENCOUNTER — Telehealth: Payer: Self-pay | Admitting: Family

## 2020-07-22 DIAGNOSIS — I10 Essential (primary) hypertension: Secondary | ICD-10-CM

## 2020-07-22 MED ORDER — LOSARTAN POTASSIUM 100 MG PO TABS: 100.0000 mg | ORAL_TABLET | Freq: Every day | ORAL | 1 refills | Status: DC

## 2020-07-22 NOTE — Telephone Encounter (Signed)
Pt needs a refill on losartan (COZAAR) 100 MG tablet sent to [facility]

## 2020-07-28 ENCOUNTER — Ambulatory Visit (INDEPENDENT_AMBULATORY_CARE_PROVIDER_SITE_OTHER): Payer: BC Managed Care – PPO | Admitting: Family

## 2020-07-28 ENCOUNTER — Telehealth: Payer: Self-pay

## 2020-07-28 ENCOUNTER — Encounter: Payer: Self-pay | Admitting: Family

## 2020-07-28 ENCOUNTER — Other Ambulatory Visit: Payer: Self-pay

## 2020-07-28 VITALS — BP 120/74 | HR 68 | Temp 98.5°F | Resp 15 | Ht 69.0 in | Wt 192.8 lb

## 2020-07-28 DIAGNOSIS — Z125 Encounter for screening for malignant neoplasm of prostate: Secondary | ICD-10-CM | POA: Diagnosis not present

## 2020-07-28 DIAGNOSIS — I10 Essential (primary) hypertension: Secondary | ICD-10-CM

## 2020-07-28 DIAGNOSIS — E785 Hyperlipidemia, unspecified: Secondary | ICD-10-CM

## 2020-07-28 DIAGNOSIS — E1165 Type 2 diabetes mellitus with hyperglycemia: Secondary | ICD-10-CM | POA: Diagnosis not present

## 2020-07-28 NOTE — Chronic Care Management (AMB) (Signed)
  Chronic Care Management   Note  07/28/2020 Name: Joseph Davis MRN: [id number] DOB: 08-22-1970  Joseph Davis is a 50 y.o. year old male who is a primary care patient of Allegra Grana, FNP. Joseph Davis is currently enrolled in care management services. An additional referral for Pharm D was placed.   Follow up plan: Unsuccessful telephone outreach attempt made. A HIPAA compliant phone message was left for the patient providing contact information and requesting a return call.  The care management team will reach out to the patient again over the next 7 days.  If patient returns call to provider office, please advise to call Embedded Care Management Care Guide Penne Lash  at [phone]  Penne Lash, RMA Care Guide, Embedded Care Coordination [facility]  [place], [place] [address] Direct Dial: [phone] [email or web address]: [email or web address]

## 2020-07-28 NOTE — Assessment & Plan Note (Signed)
Due for a1c. Continue metformin 1000mg  bid, januvia 100mg . Will collaborate with Catie , pharmD as it related to DM management and prescription cost. Would prefer patient to be on jardiance , not . Due to suspected family h/o MGM thyroid cancer, we will not start GLP1.

## 2020-07-28 NOTE — Progress Notes (Signed)
Subjective:    Patient ID: Joseph Davis, male    DOB: Sep 20, 1970, 50 y.o.   MRN: [id number]  CC: Joseph Davis is a 50 y.o. male who presents today for follow up.   HPI: Accompanied by  Mother  Feels well today  no complaints  DM- compliant with metformin 1071m bid, januvia 1018mhowever runs out in 30 days ; it had been $5 for month supply and price will go up.   MGM had thyroid cancer.   FBG 96 today. He hasnt started the jardiance due to cost.   HTN- compliant with losartan 1002mNo cp  HLd- compliant with crestor 53m34meclines colonoscopy.   HISTORY:  Past Medical History:  Diagnosis Date  . Bell's palsy   . [facility]. No pertinent surgical history. Family History  Problem Relation Age of Onset  . Diabetes Mother   . Diabetes Father   . Thyroid cancer Maternal Grandmother     Allergies: Patient has no known allergies. Current Outpatient Medications on File Prior to Visit  Medication Sig Dispense Refill  . aspirin EC 81 MG tablet Take 81 mg by mouth daily.    . blood glucose meter kit and supplies Dispense based on patient and insurance preference. Use up to four times daily as directed. (FOR ICD-10 E10.9, E11.9).  Check blood sugar up to 4 times per day. 1 each 0  . Ergocalciferol 50 MCG (2000 UT) CAPS Take 0.5 capsules by mouth daily. 60 capsule 2  . JANUVIA 100 MG tablet Take 100 mg by mouth daily.    . loMarland Kitchenartan (COZAAR) 100 MG tablet Take 1 tablet (100 mg total) by mouth daily. 90 tablet 1  . metFORMIN (GLUCOPHAGE) 1000 MG tablet Start with 1000 mg (1 tablet) daily in the AM for 7 days. Then, increase to 1 tablet two times per day. 180 tablet 3  . rosuvastatin (CRESTOR) 10 MG tablet Take 1 tablet (10 mg total) by mouth daily. 90 tablet 0  . vitamin B-12 (V-R VITAMIN B-12) 500 MCG tablet Take 1 tablet (500 mcg total) by mouth daily. 30 tablet 4   No current facility-administered medications on file prior to  visit.    Social History   Tobacco Use  . Smoking status: Never Smoker  . Smokeless tobacco: Never Used  Vaping Use  . Vaping Use: Never used  Substance Use Topics  . Alcohol use: Never  . Drug use: Never    Review of Systems  Constitutional: Negative for chills and fever.  Respiratory: Negative for cough.   Cardiovascular: Negative for chest pain and palpitations.  Gastrointestinal: Negative for nausea and vomiting.  Genitourinary: Negative for difficulty urinating.      Objective:    BP 120/74 (BP Location: Left Arm, Patient Position: Sitting, Cuff Size: Large)   Pulse 68   Temp 98.5 F (36.9 C) (Oral)   Resp 15   Ht 5' 9"  (1.753 m)   Wt 192 lb 12.8 oz (87.5 kg)   SpO2 98%   BMI 28.47 kg/m  BP Readings from Last 3 Encounters:  07/28/20 120/74  04/20/20 118/78  01/13/20 (!) 132/92   Wt Readings from Last 3 Encounters:  07/28/20 192 lb 12.8 oz (87.5 kg)  04/20/20 198 lb 3.2 oz (89.9 kg)  01/13/20 195 lb 6.4 oz (88.6 kg)    Physical Exam Vitals reviewed.  Constitutional:      Appearance: He is well-developed.  Cardiovascular:  Rate and Rhythm: Regular rhythm.     Heart sounds: Normal heart sounds.  Pulmonary:     Effort: Pulmonary effort is normal. No respiratory distress.     Breath sounds: Normal breath sounds. No wheezing, rhonchi or rales.  Skin:    General: Skin is warm and dry.  Neurological:     Mental Status: He is alert.  Psychiatric:        Speech: Speech normal.        Behavior: Behavior normal.        Assessment & Plan:   Problem List Items Addressed This Visit      Cardiovascular and Mediastinum   HTN (hypertension)    Controlled. Continue  Losartan 153m        Endocrine   Type 2 diabetes mellitus with hyperglycemia, without long-term current use of insulin ([place] - Primary    Due for a1c. Continue metformin 10064mbid, januvia 1006mWill collaborate with Catie TraDarnelle MaffucciharmD as it related to DM management and prescription  cost. Would prefer patient to be on jardiance , not janTongaue to suspected family h/o MGM thyroid cancer, we will not start GLP1.      Relevant Medications   JANUVIA 100 MG tablet   Other Relevant Orders   Hemoglobin A1c   Comprehensive metabolic panel   [facility]  Other   HLD (hyperlipidemia)    Controlled. Continue crestor 95m51m    Other Visit Diagnoses    Screening for prostate cancer       Relevant Orders   PSA       I have discontinued ChriGeorgianne Fickrshall's empagliflozin. I am also having him maintain his blood glucose meter kit and supplies, vitamin B-12, aspirin EC, Ergocalciferol, rosuvastatin, metFORMIN, losartan, and Januvia.   No orders of the defined types were placed in this encounter.   Return precautions given.   Risks, benefits, and alternatives of the medications and treatment plan prescribed today were discussed, and patient expressed understanding.   Education regarding symptom management and diagnosis given to patient on AVS.  Continue to follow with ArneBurnard HawthorneP for routine health maintenance.   ChriIgnacia Palma I agreed with plan.   MargMable ParisP

## 2020-07-28 NOTE — Assessment & Plan Note (Signed)
Controlled. Continue crestor 10mg 

## 2020-07-28 NOTE — Assessment & Plan Note (Signed)
Controlled. Continue  Losartan 100mg 

## 2020-08-04 ENCOUNTER — Other Ambulatory Visit: Payer: Self-pay

## 2020-08-04 ENCOUNTER — Other Ambulatory Visit (INDEPENDENT_AMBULATORY_CARE_PROVIDER_SITE_OTHER): Payer: BC Managed Care – PPO

## 2020-08-04 DIAGNOSIS — Z125 Encounter for screening for malignant neoplasm of prostate: Secondary | ICD-10-CM

## 2020-08-04 DIAGNOSIS — E1165 Type 2 diabetes mellitus with hyperglycemia: Secondary | ICD-10-CM

## 2020-08-04 LAB — COMPREHENSIVE METABOLIC PANEL
ALT: 25 U/L (ref 0–53)
AST: 30 U/L (ref 0–37)
Albumin: 4.5 g/dL (ref 3.5–5.2)
Alkaline Phosphatase: 83 U/L (ref 39–117)
BUN: 15 mg/dL (ref 6–23)
CO2: 30 mEq/L (ref 19–32)
Calcium: 10.1 mg/dL (ref 8.4–10.5)
Chloride: 101 mEq/L (ref 96–112)
Creatinine, Ser: 0.95 mg/dL (ref 0.40–1.50)
GFR: 92.74 mL/min (ref >60.00)
Glucose, Bld: 95 mg/dL (ref 70–99)
Potassium: 4.2 mEq/L (ref 3.5–5.1)
Sodium: 140 mEq/L (ref 135–145)
Total Bilirubin: 0.7 mg/dL (ref 0.2–1.2)
Total Protein: 7.3 g/dL (ref 6.0–8.3)

## 2020-08-04 LAB — HEMOGLOBIN A1C: Hgb A1c MFr Bld: 6.8 % — ABNORMAL HIGH (ref 4.6–6.5)

## 2020-08-04 LAB — PSA: PSA: 0.43 ng/mL (ref 0.10–4.00)

## 2020-08-04 NOTE — Chronic Care Management (AMB) (Signed)
  Chronic Care Management   Note  08/04/2020 Name: REMER COUSE MRN: [id number] DOB: 10-Apr-1971  RIGLEY NIESS is a 50 y.o. year old male who is a primary care patient of Allegra Grana, FNP. HAZEN BRUMETT is currently enrolled in care management services. An additional referral for Pharm D  was placed.   Follow up plan: Unsuccessful telephone outreach attempt made. A HIPAA compliant phone message was left for the patient providing contact information and requesting a return call.  The care management team will reach out to the patient again over the next 7 days.  If patient returns call to provider office, please advise to call Embedded Care Management Care Guide Penne Lash  at [phone]  Penne Lash, RMA Care Guide, Embedded Care Coordination [facility]  [place], [place] [address] Direct Dial: [phone] [email or web address]: [email or web address]

## 2020-08-11 ENCOUNTER — Telehealth: Payer: Self-pay

## 2020-08-11 NOTE — Chronic Care Management (AMB) (Signed)
  Chronic Care Management   Note  08/11/2020 Name: Joseph Davis MRN: [id number] DOB: May 22, 1970  Joseph Davis is a 50 y.o. year old male who is a primary care patient of Allegra Grana, FNP. Joseph Davis is currently enrolled in care management services. An additional referral for Pharm D was placed.   Follow up plan: Unable to make contact on outreach attempts x 3. PCP Jason Coop Lyn Records, FNP notified via routed documentation in medical record.  We have been unable to make contact with the patient for follow up. The care management team is available to follow up with the patient after provider conversation with the patient regarding recommendation for care management engagement and subsequent re-referral to the care management team.   Penne Lash, RMA Care Guide, Embedded Care Coordination [facility]  [place], [place] [address] Direct Dial: [phone] [email or web address]

## 2020-08-11 NOTE — Telephone Encounter (Signed)
LMTCB for lab results.  

## 2020-08-20 ENCOUNTER — Other Ambulatory Visit: Payer: Self-pay

## 2020-08-20 ENCOUNTER — Telehealth: Payer: Self-pay

## 2020-08-20 MED ORDER — JANUVIA 100 MG PO TABS: 100.0000 mg | ORAL_TABLET | Freq: Every day | ORAL | 0 refills | Status: DC

## 2020-08-20 NOTE — Telephone Encounter (Signed)
Sent to [facility]

## 2020-08-20 NOTE — Telephone Encounter (Signed)
Pt needs a refill of JANUVIA 100 MG tablet sent to [facility] on [address]

## 2020-09-24 ENCOUNTER — Other Ambulatory Visit: Payer: Self-pay | Admitting: Family

## 2020-09-24 ENCOUNTER — Telehealth: Payer: Self-pay

## 2020-09-24 MED ORDER — ROSUVASTATIN CALCIUM 10 MG PO TABS: 10.0000 mg | ORAL_TABLET | Freq: Every day | ORAL | 0 refills | Status: DC

## 2020-09-24 NOTE — Telephone Encounter (Signed)
Pt needs refill of rosuvastatin (CRESTOR) 10 MG tablet

## 2020-10-29 LAB — HM DIABETES EYE EXAM

## 2020-11-02 ENCOUNTER — Encounter: Payer: Self-pay | Admitting: Family

## 2020-11-02 ENCOUNTER — Ambulatory Visit (INDEPENDENT_AMBULATORY_CARE_PROVIDER_SITE_OTHER): Payer: BC Managed Care – PPO | Admitting: Family

## 2020-11-02 ENCOUNTER — Other Ambulatory Visit: Payer: Self-pay

## 2020-11-02 VITALS — BP 122/78 | HR 78 | Temp 98.5°F | Ht 69.0 in | Wt 199.4 lb

## 2020-11-02 DIAGNOSIS — E785 Hyperlipidemia, unspecified: Secondary | ICD-10-CM | POA: Diagnosis not present

## 2020-11-02 DIAGNOSIS — I10 Essential (primary) hypertension: Secondary | ICD-10-CM | POA: Diagnosis not present

## 2020-11-02 DIAGNOSIS — E1165 Type 2 diabetes mellitus with hyperglycemia: Secondary | ICD-10-CM

## 2020-11-02 LAB — POCT GLYCOSYLATED HEMOGLOBIN (HGB A1C): Hemoglobin A1C: 6.5 % — AB (ref 4.0–5.6)

## 2020-11-02 MED ORDER — METFORMIN HCL 1000 MG PO TABS: 1000.0000 mg | ORAL_TABLET | Freq: Two times a day (BID) | ORAL | 3 refills | Status: DC

## 2020-11-02 MED ORDER — LOSARTAN POTASSIUM 100 MG PO TABS: 100.0000 mg | ORAL_TABLET | Freq: Every day | ORAL | 1 refills | Status: DC

## 2020-11-02 MED ORDER — ROSUVASTATIN CALCIUM 10 MG PO TABS: 10.0000 mg | ORAL_TABLET | Freq: Every day | ORAL | 2 refills | Status: DC

## 2020-11-02 NOTE — Assessment & Plan Note (Signed)
Excellent control.  Continue metformin 100 mg twice daily, Januvia 100 mg.

## 2020-11-02 NOTE — Assessment & Plan Note (Signed)
Chronic, LDL less than 70.  Continue Crestor 10 mg.  We will update lipid panel at follow-up.

## 2020-11-02 NOTE — Assessment & Plan Note (Signed)
Chronic, stable.  Continue losartan 100mg °

## 2020-11-02 NOTE — Progress Notes (Signed)
Subjective:    Patient ID: Joseph Davis, male    DOB: 1970/06/03, 50 y.o.   MRN: [id number]  CC: Joseph Davis is a 50 y.o. male who presents today for follow up.   HPI: Accompanied by mother today. Feels well.  No complaints.  He is due for colonoscopy.  He declines screening for colon cancer at this time.  He declines colonoscopy and he also declines Cologuard.    He had his eye exam last week and reports that this was normal.    DM- compliant with metformin 1037m bid, januvia 1057m Hypertension-compliant with losartan 100 mg  Hyperlipidemia-compliant with Crestor 10 mg   HISTORY:  Past Medical History:  Diagnosis Date   Bell's palsy    [facility]   No past surgical history on file. Family History  Problem Relation Age of Onset   Diabetes Mother    Diabetes Father    Thyroid cancer Maternal Grandmother     Allergies: Patient has no known allergies. Current Outpatient Medications on File Prior to Visit  Medication Sig Dispense Refill   aspirin EC 81 MG tablet Take 81 mg by mouth daily.     blood glucose meter kit and supplies Dispense based on patient and insurance preference. Use up to four times daily as directed. (FOR ICD-10 E10.9, E11.9).  Check blood sugar up to 4 times per day. 1 each 0   Ergocalciferol 50 MCG (2000 UT) CAPS Take 0.5 capsules by mouth daily. 60 capsule 2   JANUVIA 100 MG tablet Take 1 tablet (100 mg total) by mouth daily. 90 tablet 0   vitamin B-12 (V-R VITAMIN B-12) 500 MCG tablet Take 1 tablet (500 mcg total) by mouth daily. 30 tablet 4   No current facility-administered medications on file prior to visit.    Social History   Tobacco Use   Smoking status: Never   Smokeless tobacco: Never  Vaping Use   Vaping Use: Never used  Substance Use Topics   Alcohol use: Never   Drug use: Never    Review of Systems    Objective:    BP 122/78   Pulse 78   Temp 98.5 F (36.9 C) (Oral)   Ht _0  (1.753 m)   Wt  199 lb 6.4 oz (90.4 kg)   SpO2 96%   BMI 29.45 kg/m  BP Readings from Last 3 Encounters:  11/02/20 122/78  07/28/20 120/74  04/20/20 118/78   Wt Readings from Last 3 Encounters:  11/02/20 199 lb 6.4 oz (90.4 kg)  07/28/20 192 lb 12.8 oz (87.5 kg)  04/20/20 198 lb 3.2 oz (89.9 kg)    Physical Exam     Assessment & Plan:   Problem List Items Addressed This Visit       Cardiovascular and Mediastinum   HTN (hypertension)    Chronic, stable.  Continue losartan 100 mg.       Relevant Medications   losartan (COZAAR) 100 MG tablet   rosuvastatin (CRESTOR) 10 MG tablet     Endocrine   Type 2 diabetes mellitus with hyperglycemia, without long-term current use of insulin (HCC)    Excellent control.  Continue metformin 100 mg twice daily, Januvia 100 mg.       Relevant Medications   metFORMIN (GLUCOPHAGE) 1000 MG tablet   losartan (COZAAR) 100 MG tablet   rosuvastatin (CRESTOR) 10 MG tablet     Other   HLD (hyperlipidemia) - Primary    Chronic, LDL  less than 70.  Continue Crestor 10 mg.  We will update lipid panel at follow-up.       Relevant Medications   losartan (COZAAR) 100 MG tablet   rosuvastatin (CRESTOR) 10 MG tablet     I have changed Joseph Davis's metFORMIN. I am also having him maintain his blood glucose meter kit and supplies, vitamin B-12, aspirin EC, Ergocalciferol, Januvia, losartan, and rosuvastatin.   Meds ordered this encounter  Medications   metFORMIN (GLUCOPHAGE) 1000 MG tablet    Sig: Take 1 tablet (1,000 mg total) by mouth 2 (two) times daily with a meal.    Dispense:  180 tablet    Refill:  3    Order Specific Question:   Supervising Provider    Answer:   Joseph Davis [[id number]]   losartan (COZAAR) 100 MG tablet    Sig: Take 1 tablet (100 mg total) by mouth daily.    Dispense:  90 tablet    Refill:  1    Order Specific Question:   Supervising Provider    Answer:   Joseph Davis [[id number]]   rosuvastatin (CRESTOR) 10 MG  tablet    Sig: Take 1 tablet (10 mg total) by mouth daily.    Dispense:  90 tablet    Refill:  2    Order Specific Question:   Supervising Provider    Answer:   Joseph Davis [[id number]]     Return precautions given.   Risks, benefits, and alternatives of the medications and treatment plan prescribed today were discussed, and patient expressed understanding.   Education regarding symptom management and diagnosis given to patient on AVS.  Continue to follow with Joseph Hawthorne, FNP for routine health maintenance.   Joseph Davis and I agreed with plan.   Joseph Paris, FNP

## 2020-11-02 NOTE — Addendum Note (Signed)
Addended by: Sherley Bounds on: 11/02/2020 08:45 AM   Modules accepted: Orders

## 2020-11-18 ENCOUNTER — Other Ambulatory Visit: Payer: Self-pay | Admitting: Family

## 2020-12-08 IMAGING — MR MRA HEAD WITHOUT CONTRAST
13 of 15 series · 40 of 48 positions shown · IV contrast (agent unspecified)
Comparison: CT head 11/26/2012

CLINICAL DATA: Right third nerve palsy

EXAM:
MRI HEAD WITHOUT AND WITH CONTRAST
MRA HEAD WITHOUT CONTRAST
TECHNIQUE: Multiplanar, multiecho pulse sequences of the brain and surrounding
structures were obtained without and with intravenous contrast.
Angiographic images of the head were obtained using MRA technique
without contrast.
CONTRAST:  10 mL Gadovist IV

[Series 5: ax dwi_tracew · axial · 3.0mm · 0.60mm/px · z∈[-58,+105]mm · 2 of 52 slices shown]
[im 1/52]
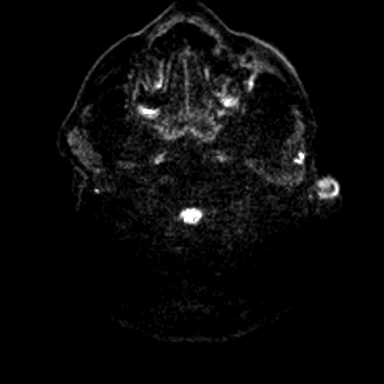
[im 52/52]
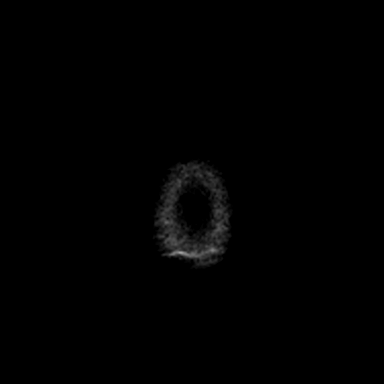

[Series 6: ax dwi_adc · axial · 3.0mm · 0.60mm/px · z∈[-58,+105]mm · 2 of 52 slices shown]
[im 1/52]
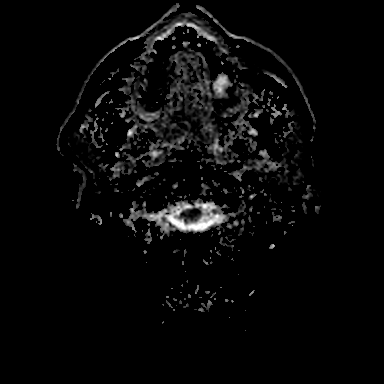
[im 52/52]
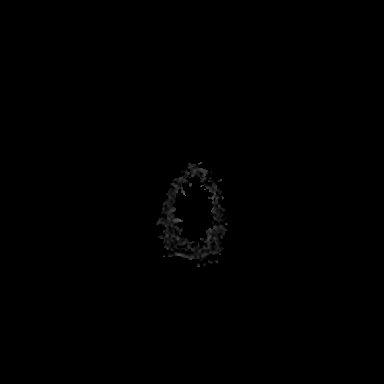

[Series 7: T1 · sagittal · 5.0mm · 0.62mm/px · 1 of 24 slices shown (1 of 2)]
[im 1/24]
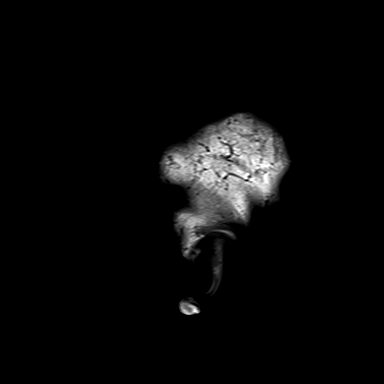

[Series 8: T2 · axial · 5.0mm · 0.53mm/px · 1 of 29 slices shown]
[im 1/29]
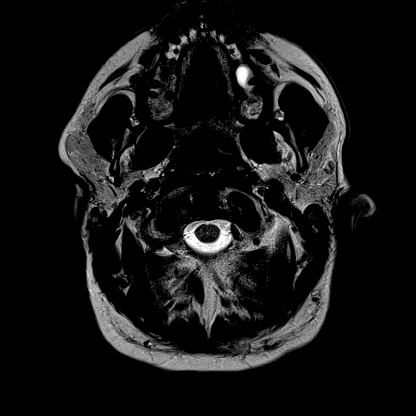

[Series 9: cor dwi_tracew · coronal · 5.0mm · 0.60mm/px · 2 of 44 slices shown]
[im 1/44]
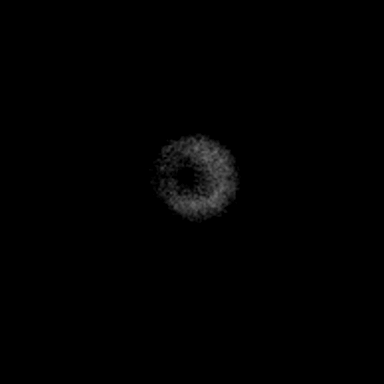
[im 44/44]
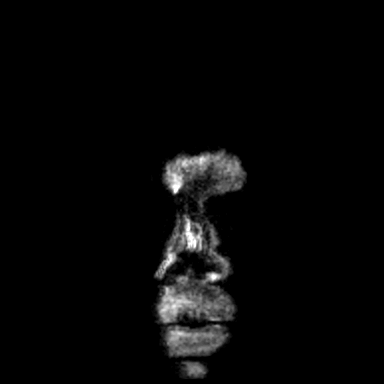

[Series 10: cor dwi_adc · coronal · 5.0mm · 0.60mm/px · 2 of 44 slices shown]
[im 1/44]
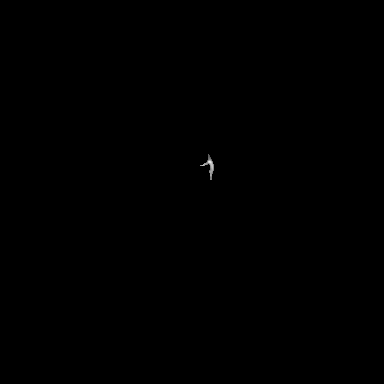
[im 44/44]
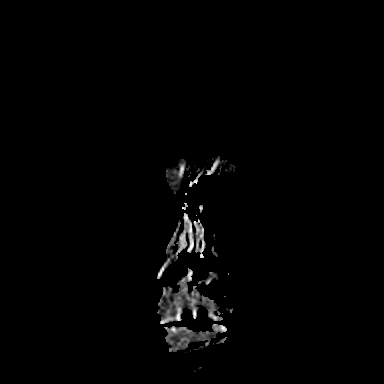

[Series 11: tof_cs_(id) · axial · 0.5mm · 0.48mm/px · z∈[-51,+52]mm · 8 of 232 slices shown]
[im 1/232]
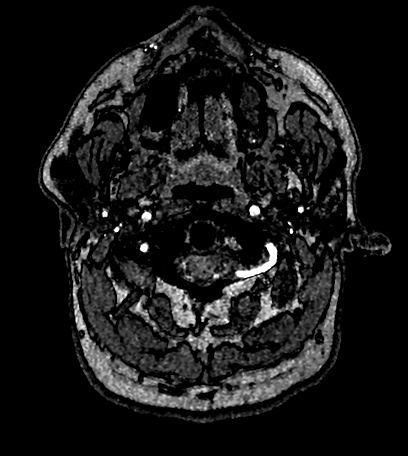
[im 26/232]
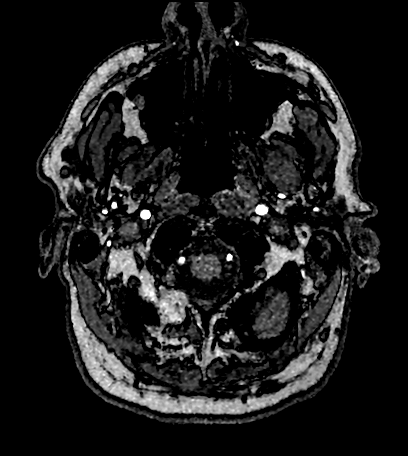
[im 78/232]
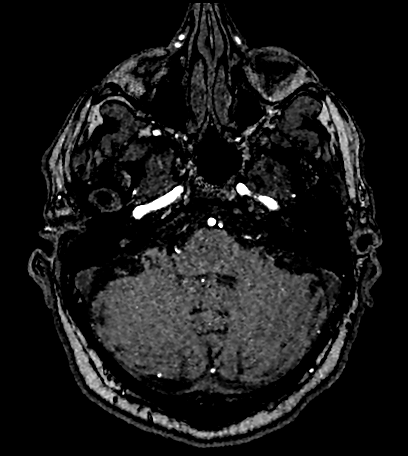
[im 103/232]
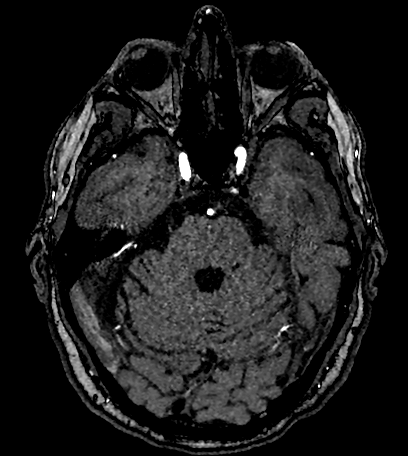
[im 129/232]
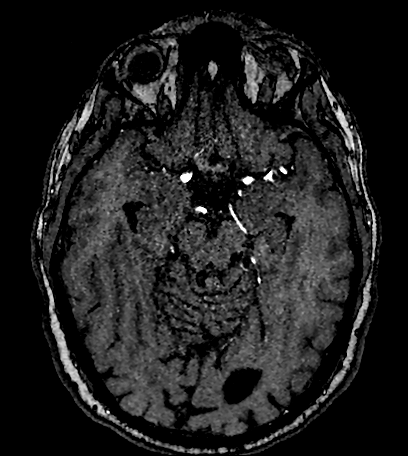
[im 155/232]
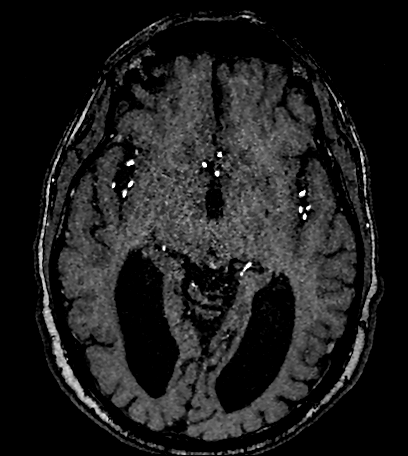
[im 206/232]
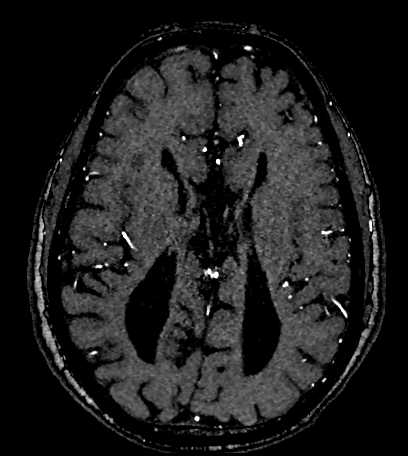
[im 232/232]
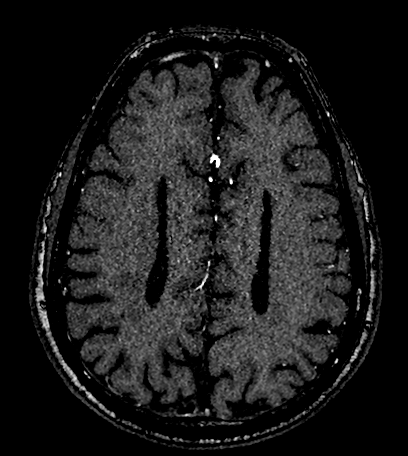

[Series 16: mag_images · axial · 3.0mm · 0.90mm/px · z∈[-63,+21]mm · 2 of 60 slices shown]
[im 1/60]
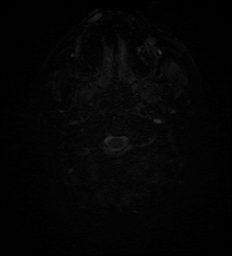
[im 30/60]
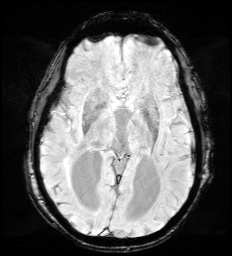

[Series 20: FLAIR · axial · 3.0mm · 0.53mm/px · z∈[-56,+101]mm · 2 of 55 slices shown]
[im 1/55]
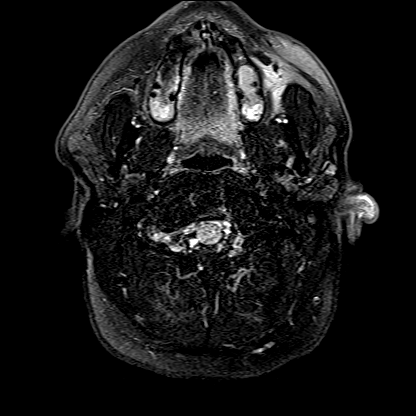
[im 55/55]
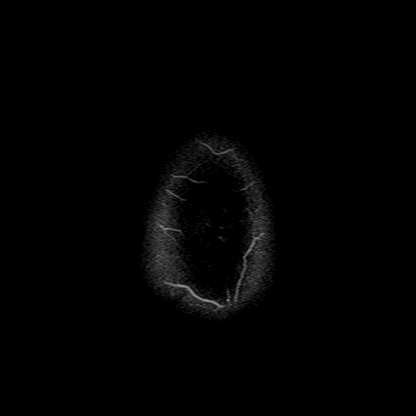

[Series 21: T1 · axial · 1.0mm · 0.98mm/px · z∈[-59,+111]mm · 8 of 176 slices shown (2 of 2)]
[im 1/176]
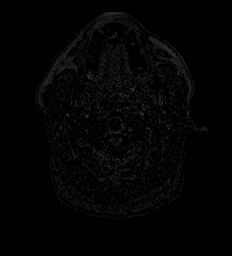
[im 26/176]
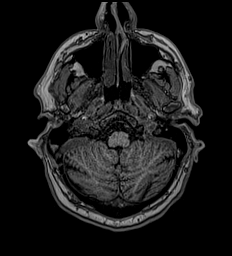
[im 51/176]
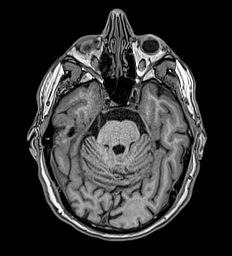
[im 76/176]
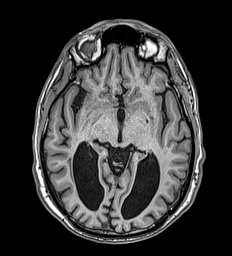
[im 101/176]
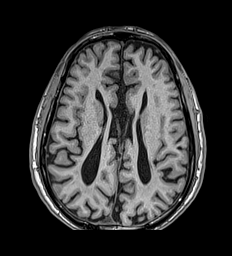
[im 126/176]
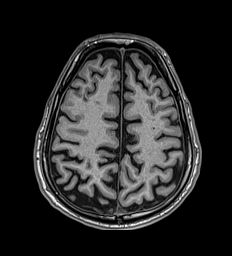
[im 151/176]
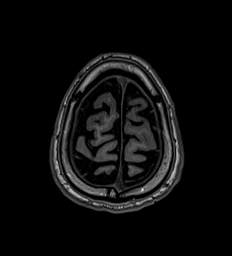
[im 176/176]
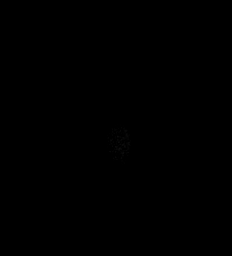

[Series 22: T2 post-contrast · coronal · 5.0mm · 0.57mm/px · 1 of 32 slices shown]
[im 1/32]
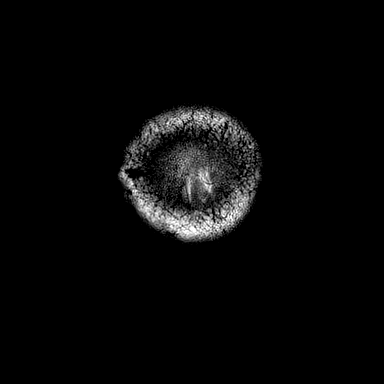

[Series 23: T1 post-contrast · axial · 1.0mm · 0.98mm/px · z∈[-59,+111]mm · 8 of 176 slices shown (1 of 2)]
[im 1/176]
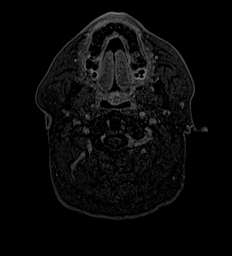
[im 26/176]
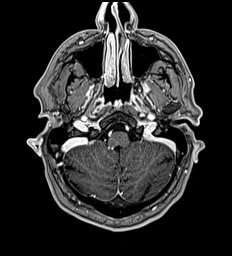
[im 51/176]
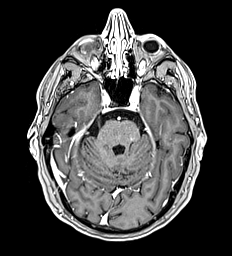
[im 76/176]
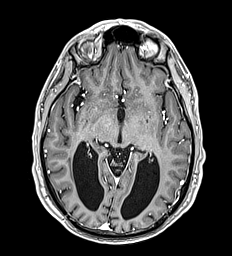
[im 101/176]
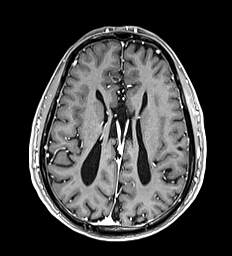
[im 126/176]
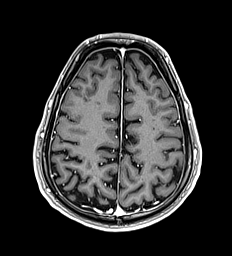
[im 151/176]
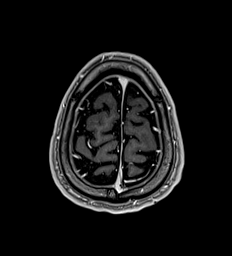
[im 176/176]
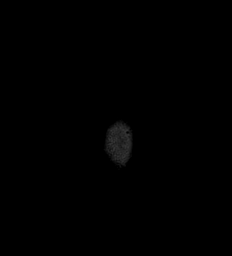

[Series 24: T1 post-contrast · coronal · 5.0mm · 0.57mm/px · 1 of 32 slices shown (2 of 2)]
[im 1/32]
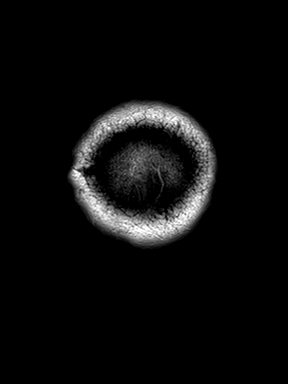

[40 of 48 positions shown; findings below may reference images not displayed]

FINDINGS: MRI HEAD FINDINGS

Brain: Complete agenesis corpus callosum. No lipoma. Dilated
occipital poles compatible with associated colpocephaly.

Negative for acute infarct. Mild white matter disease bilaterally
mostly in the parietal white matter. Scattered small white matter
hyperintensities also in the frontal lobes. Small chronic cortical
infarct in the right frontal lobe. Chronic microhemorrhage
associated with the right frontal chronic infarct. Chronic
microhemorrhage also in the right posterior temporal lobe and left
cerebellum.

Negative for mass or edema. Normal enhancement postcontrast
administration.

Vascular: Normal arterial flow voids.

Skull and upper cervical spine: Negative

Sinuses/Orbits: Mucosal edema left maxillary sinus.  Normal orbit

Other: None

MRA HEAD FINDINGS

Both vertebral arteries widely patent to the basilar. Right PICA
patent. Prominent left AICA appears to supply left PICA territory.
Basilar widely patent. The superior cerebellar and posterior
cerebral arteries are patent bilaterally. Patent posterior
communicating artery on the left. Negative for posterior
communicating artery aneurysm.

Internal carotid artery widely patent bilaterally. Anterior and
middle cerebral arteries widely patent bilaterally without stenosis.

Negative for cerebral aneurysm.
IMPRESSION: 1. Negative for cerebral aneurysm. No cause for right-sided third
nerve palsy
2. Complete agenesis corpus callosum
3. Mild chronic ischemic changes in the white matter. Small chronic
hemorrhagic infarct right frontal lobe. Additional small areas of
microhemorrhage in the right posterior temporal lobe and left
cerebellum. Correlate with history of hypertension. No acute
infarct.

## 2020-12-16 ENCOUNTER — Telehealth: Payer: Self-pay | Admitting: Family

## 2020-12-16 NOTE — Telephone Encounter (Signed)
Patient calling in for refill for the Rosuvastatin. Informed Patient that this was sent in 11/02/20 90 day supply with 2 refills.   Patient verbalized understanding

## 2021-02-08 ENCOUNTER — Ambulatory Visit: Payer: BC Managed Care – PPO | Admitting: Family

## 2021-02-17 ENCOUNTER — Telehealth: Payer: Self-pay | Admitting: Family

## 2021-02-17 ENCOUNTER — Other Ambulatory Visit: Payer: Self-pay | Admitting: Family

## 2021-02-17 MED ORDER — JANUVIA 100 MG PO TABS: 100.0000 mg | ORAL_TABLET | Freq: Every day | ORAL | 0 refills | Status: DC

## 2021-02-17 NOTE — Telephone Encounter (Signed)
Patient is out of his JANUVIA 100 MG tablet and needs a refill.

## 2021-02-22 ENCOUNTER — Other Ambulatory Visit: Payer: Self-pay

## 2021-02-22 ENCOUNTER — Encounter: Payer: Self-pay | Admitting: Family

## 2021-02-22 ENCOUNTER — Ambulatory Visit (INDEPENDENT_AMBULATORY_CARE_PROVIDER_SITE_OTHER): Payer: BC Managed Care – PPO | Admitting: Family

## 2021-02-22 VITALS — BP 124/82 | HR 69 | Temp 98.4°F | Ht 69.0 in | Wt 204.2 lb

## 2021-02-22 DIAGNOSIS — Z23 Encounter for immunization: Secondary | ICD-10-CM | POA: Diagnosis not present

## 2021-02-22 DIAGNOSIS — E1165 Type 2 diabetes mellitus with hyperglycemia: Secondary | ICD-10-CM | POA: Diagnosis not present

## 2021-02-22 DIAGNOSIS — E785 Hyperlipidemia, unspecified: Secondary | ICD-10-CM | POA: Diagnosis not present

## 2021-02-22 DIAGNOSIS — I10 Essential (primary) hypertension: Secondary | ICD-10-CM | POA: Diagnosis not present

## 2021-02-22 LAB — POCT GLYCOSYLATED HEMOGLOBIN (HGB A1C): Hemoglobin A1C: 7.2 % — AB (ref 4.0–5.6)

## 2021-02-22 LAB — COMPREHENSIVE METABOLIC PANEL
ALT: 30 U/L (ref 0–53)
AST: 32 U/L (ref 0–37)
Albumin: 4.8 g/dL (ref 3.5–5.2)
Alkaline Phosphatase: 62 U/L (ref 39–117)
BUN: 11 mg/dL (ref 6–23)
CO2: 31 mEq/L (ref 19–32)
Calcium: 9.7 mg/dL (ref 8.4–10.5)
Chloride: 98 mEq/L (ref 96–112)
Creatinine, Ser: 1.06 mg/dL (ref 0.40–1.50)
GFR: 81 mL/min (ref >60.00)
Glucose, Bld: 95 mg/dL (ref 70–99)
Potassium: 4.4 mEq/L (ref 3.5–5.1)
Sodium: 137 mEq/L (ref 135–145)
Total Bilirubin: 0.6 mg/dL (ref 0.2–1.2)
Total Protein: 7.7 g/dL (ref 6.0–8.3)

## 2021-02-22 LAB — LIPID PANEL
Cholesterol: 84 mg/dL (ref 0–200)
HDL: 34.7 mg/dL — ABNORMAL LOW (ref >39.00)
LDL Cholesterol: 39 mg/dL (ref 0–99)
NonHDL: 49.42
Total CHOL/HDL Ratio: 2
Triglycerides: 52 mg/dL (ref 0.0–149.0)
VLDL: 10.4 mg/dL (ref 0.0–40.0)

## 2021-02-22 NOTE — Assessment & Plan Note (Signed)
Slightly elevated today.  Discussed recent weight gain.  Patient will focus on weight loss, low glycemic diet.  We will make changes to antihypertensive regimen if blood pressure remains elevated at follow-up.  Continue losartan 100 mg

## 2021-02-22 NOTE — Assessment & Plan Note (Signed)
Chronic, stable.  Pending lipid panel.  Continue Crestor 10 mg 

## 2021-02-22 NOTE — Assessment & Plan Note (Signed)
Lab Results  Component Value Date   HGBA1C 7.2 (A) 02/22/2021   ).  Discussed greater focus on dietary changes and low glycemic diet.  He politely declines referral to nutritionist at this time.  If A1c at follow-up remains greater than 6.5, we will stop Januvia and start GLP-1 agonist.

## 2021-02-22 NOTE — Patient Instructions (Signed)
As discussed, lets focus much more on diet.  If you would like to consider nutritionist referral, please let me know.  This is  Dr. Melina Schools  example of a  "Low GI"  Diet:  It will allow you to lose 4 to 8  lbs  per month if you follow it carefully.  Your goal with exercise is a minimum of 30 minutes of aerobic exercise 5 days per week (Walking does not count once it becomes easy!)    All of the foods can be found at grocery stores and in bulk at [employer].  The Atkins protein bars and shakes are available in more varieties at Target, WalMart and Lowe's Foods.     7 AM Breakfast:  Choose from the following:  Low carbohydrate Protein  Shakes (I recommend the  Premier Protein chocolate shakes,  EAS AdvantEdge "Carb Control" shakes  Or the Atkins shakes all are under 3 net carbs)     a scrambled egg/bacon/cheese burrito made with Mission's "carb balance" whole wheat tortilla  (about 10 net carbs )  Medical laboratory scientific officer (basically a quiche without the pastry crust) that is eaten cold and very convenient way to get your eggs.  8 carbs)  If you make your own protein shakes, avoid bananas and pineapple,  And use low carb greek yogurt or original /unsweetened almond or soy milk    Avoid cereal and bananas, oatmeal and cream of wheat and grits. They are loaded with carbohydrates!   10 AM: high protein snack:  Protein bar by Atkins (the snack size, under 200 cal, usually < 6 net carbs).    A stick of cheese:  Around 1 carb,  100 cal     Dannon Light n Fit [place] Yogurt  (80 cal, 8 carbs)  Other so called "protein bars" and Greek yogurts tend to be loaded with carbohydrates.  Remember, in food advertising, the word "energy" is synonymous for " carbohydrate."  Lunch:   A Sandwich using the bread choices listed, Can use any  Eggs,  lunchmeat, grilled meat or canned tuna), avocado, regular mayo/mustard  and cheese.  A Salad using blue cheese, ranch,  Goddess or vinagrette,  Avoid taco  shells, croutons or "confetti" and no "candied nuts" but regular nuts OK.   No pretzels, nabs  or chips.  Pickles and miniature sweet peppers are a good low carb alternative that provide a "crunch"  The bread is the only source of carbohydrate in a sandwich and  can be decreased by trying some of the attached alternatives to traditional loaf bread   Avoid "Low fat dressings, as well as Reyne Dumas and [employer] dressings They are loaded with sugar!   3 PM/ Mid day  Snack:  Consider  1 ounce of  almonds, walnuts, pistachios, pecans, peanuts,  Macadamia nuts or a nut medley.  Avoid "granola and granola bars "  Mixed nuts are ok in moderation as long as there are no raisins,  cranberries or dried fruit.   KIND bars are OK if you get the low glycemic index variety   Try the prosciutto/mozzarella cheese sticks by Fiorruci  In deli /backery section   High protein      6 PM  Dinner:     Meat/fowl/fish with a green salad, and either broccoli, cauliflower, green beans, spinach, brussel sprouts or  Lima beans. DO NOT BREAD THE PROTEIN!!      There is a low carb pasta by Dreamfield's that  is acceptable and tastes great: only 5 digestible carbs/serving.( All grocery stores but BJs carry it ) Several ready made meals are available low carb:   Try Michel Angelo's chicken piccata or chicken or eggplant parm over low carb pasta.(Lowes and BJs)   Clifton Custard Sanchez's "Carnitas" (pulled pork, no sauce,  0 carbs) or his beef pot roast to make a dinner burrito (at BJ's)  Pesto over low carb pasta (bj's sells a good quality pesto in the center refrigerated section of the deli   Try satueeing  Roosvelt Harps with mushroooms as a good side   Green Giant makes a mashed cauliflower that tastes like mashed potatoes  Whole wheat pasta is still full of digestible carbs and  Not as low in glycemic index as Dreamfield's.   Brown rice is still rice,  So skip the rice and noodles if you eat [place] or [place] (or at least limit to  1/2 cup)  9 PM snack :   Breyer's "low carb" fudgsicle or  ice cream bar (Carb Smart line), or  Weight Watcher's ice cream bar , or another "no sugar added" ice cream;  a serving of fresh berries/cherries with whipped cream   Cheese or DANNON'S LlGHT N FIT GREEK YOGURT  8 ounces of [place] unsweetened almond/cococunut milk    Treat yourself to a parfait made with whipped cream blueberiies, walnuts and vanilla greek yogurt  Avoid bananas, pineapple, grapes  and watermelon on a regular basis because they are high in sugar.  THINK OF THEM AS DESSERT  Remember that snack Substitutions should be less than 10 NET carbs per serving and meals < 20 carbs. Remember to subtract fiber grams to get the "net carbs."  Carbohydrate Counting for Diabetes Mellitus, Adult Carbohydrate counting is a method of keeping track of how many carbohydrates you eat. Eating carbohydrates increases the amount of sugar (glucose) in the blood. Counting how many carbohydrates you eat improves how well you manage your blood glucose. This, in turn, helps you manage your diabetes. Carbohydrates are measured in grams (g) per serving. It is important to know how many carbohydrates (in grams or by serving size) you can have in each meal. This is different for every person. A dietitian can help you make a meal plan and calculate how many carbohydrates you should have at each meal and snack. What foods contain carbohydrates? Carbohydrates are found in the following foods: Grains, such as breads and cereals. Dried beans and soy products. Starchy vegetables, such as potatoes, peas, and corn. Fruit and fruit juices. Milk and yogurt. Sweets and snack foods, such as cake, cookies, candy, chips, and soft drinks. How do I count carbohydrates in foods? There are two ways to count carbohydrates in food. You can read food labels or learn standard serving sizes of foods. You can use either of these methods or a combination of both. Using  the Nutrition Facts label The Nutrition Facts list is included on the labels of almost all packaged foods and beverages in the [place]. It includes: The serving size. Information about nutrients in each serving, including the grams of carbohydrate per serving. To use the Nutrition Facts, decide how many servings you will have. Then, multiply the number of servings by the number of carbohydrates per serving. The resulting number is the total grams of carbohydrates that you will be having. Learning the standard serving sizes of foods When you eat carbohydrate foods that are not packaged or do not include Nutrition Facts on  the label, you need to measure the servings in order to count the grams of carbohydrates. Measure the foods that you will eat with a food scale or measuring cup, if needed. Decide how many standard-size servings you will eat. Multiply the number of servings by 15. For foods that contain carbohydrates, one serving equals 15 g of carbohydrates. For example, if you eat 2 cups or 10 oz (300 g) of strawberries, you will have eaten 2 servings and 30 g of carbohydrates (2 servings x 15 g = 30 g). For foods that have more than one food mixed, such as soups and casseroles, you must count the carbohydrates in each food that is included. The following list contains standard serving sizes of common carbohydrate-rich foods. Each of these servings has about 15 g of carbohydrates: 1 slice of bread. 1 six-inch (15 cm) tortilla. ? cup or 2 oz (53 g) cooked rice or pasta.  cup or 3 oz (85 g) cooked or canned, drained and rinsed beans or lentils.  cup or 3 oz (85 g) starchy vegetable, such as peas, corn, or squash.  cup or 4 oz (120 g) hot cereal.  cup or 3 oz (85 g) boiled or mashed potatoes, or  or 3 oz (85 g) of a large baked potato.  cup or 4 fl oz (118 mL) fruit juice. 1 cup or 8 fl oz (237 mL) milk. 1 small or 4 oz (106 g) apple.  or 2 oz (63 g) of a medium banana. 1 cup or 5  oz (150 g) strawberries. 3 cups or 1 oz (28.3 g) popped popcorn. What is an example of carbohydrate counting? To calculate the grams of carbohydrates in this sample meal, follow the steps shown below. Sample meal 3 oz (85 g) chicken breast. ? cup or 4 oz (106 g) brown rice.  cup or 3 oz (85 g) corn. 1 cup or 8 fl oz (237 mL) milk. 1 cup or 5 oz (150 g) strawberries with sugar-free whipped topping. Carbohydrate calculation Identify the foods that contain carbohydrates: Rice. Corn. Milk. Strawberries. Calculate how many servings you have of each food: 2 servings rice. 1 serving corn. 1 serving milk. 1 serving strawberries. Multiply each number of servings by 15 g: 2 servings rice x 15 g = 30 g. 1 serving corn x 15 g = 15 g. 1 serving milk x 15 g = 15 g. 1 serving strawberries x 15 g = 15 g. Add together all of the amounts to find the total grams of carbohydrates eaten: 30 g + 15 g + 15 g + 15 g = 75 g of carbohydrates total. What are tips for following this plan? Shopping Develop a meal plan and then make a shopping list. Buy fresh and frozen vegetables, fresh and frozen fruit, dairy, eggs, beans, lentils, and whole grains. Look at food labels. Choose foods that have more fiber and less sugar. Avoid processed foods and foods with added sugars. Meal planning Aim to have the same number of grams of carbohydrates at each meal and for each snack time. Plan to have regular, balanced meals and snacks. Where to find more information American Diabetes Association: diabetes.org Centers for Disease Control and Prevention: TonerPromos.no Academy of Nutrition and Dietetics: eatright.org Association of Diabetes Care & Education Specialists: diabeteseducator.org Summary Carbohydrate counting is a method of keeping track of how many carbohydrates you eat. Eating carbohydrates increases the amount of sugar (glucose) in your blood. Counting how many carbohydrates you eat improves how well  you  manage your blood glucose. This helps you manage your diabetes. A dietitian can help you make a meal plan and calculate how many carbohydrates you should have at each meal and snack. This information is not intended to replace advice given to you by your health care provider. Make sure you discuss any questions you have with your health care provider. Document Revised: 10/30/2019 Document Reviewed: 10/30/2019 Elsevier Patient Education  2022 [employer].

## 2021-02-22 NOTE — Progress Notes (Signed)
Subjective:    Patient ID: Joseph Davis, male    DOB: 23-Feb-1971, 50 y.o.   MRN: [id number]  CC: Joseph Davis is a 50 y.o. male who presents today for follow up.   HPI: Accompanied by stepfather today. Feels well, no complaints. Endorses dietary indiscretion with starches such as potatoes and bread.  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  On his 3 days off, he rests without any formal exercise.  He does not drink any sugary drinks, only diet soda.  Dm- compliant with metformin 1000 mg twice daily, Januvia 100 mg. HTN- compliant with losartan 145m. No cp, sob HLD- compliant Crestor 10 mg HISTORY:  Past Medical History:  Diagnosis Date   Bell's palsy    [place]    No past surgical history on file. Family History  Problem Relation Age of Onset   Diabetes Mother    Diabetes Father    Thyroid cancer Maternal Grandmother     Allergies: Patient has no known allergies. Current Outpatient Medications on File Prior to Visit  Medication Sig Dispense Refill   aspirin EC 81 MG tablet Take 81 mg by mouth daily.     blood glucose meter kit and supplies Dispense based on patient and insurance preference. Use up to four times daily as directed. (FOR ICD-10 E10.9, E11.9).  Check blood sugar up to 4 times per day. 1 each 0   Ergocalciferol 50 MCG (2000 UT) CAPS Take 0.5 capsules by mouth daily. 60 capsule 2   JANUVIA 100 MG tablet Take 1 tablet (100 mg total) by mouth daily. 90 tablet 0   losartan (COZAAR) 100 MG tablet Take 1 tablet (100 mg total) by mouth daily. 90 tablet 1   metFORMIN (GLUCOPHAGE) 1000 MG tablet Take 1 tablet (1,000 mg total) by mouth 2 (two) times daily with a meal. 180 tablet 3   rosuvastatin (CRESTOR) 10 MG tablet Take 1 tablet (10 mg total) by mouth daily. 90 tablet 2   vitamin B-12 (V-R VITAMIN B-12) 500 MCG tablet Take 1  tablet (500 mcg total) by mouth daily. 30 tablet 4   No current facility-administered medications on file prior to visit.    Social History   Tobacco Use   Smoking status: Never   Smokeless tobacco: Never  Vaping Use   Vaping Use: Never used  Substance Use Topics   Alcohol use: Never   Drug use: Never    Review of Systems  Constitutional:  Negative for chills and fever.  Respiratory:  Negative for cough.   Cardiovascular:  Negative for chest pain and palpitations.  Gastrointestinal:  Negative for nausea and vomiting.     Objective:    BP 124/82   Pulse 69   Temp 98.4 F (36.9 C) (Oral)   Ht _0  (1.753 m)   Wt 204 lb 3.2 oz (92.6 kg)   SpO2 98%   BMI 30.16 kg/m  BP Readings from Last 3 Encounters:  02/22/21 124/82  11/02/20 122/78  07/28/20 120/74   Wt Readings from Last 3 Encounters:  02/22/21 204 lb 3.2 oz (92.6 kg)  11/02/20 199 lb 6.4 oz (90.4 kg)  07/28/20 192 lb 12.8 oz (87.5 kg)    Physical Exam Vitals reviewed.  Constitutional:      Appearance: He is well-developed.  Cardiovascular:     Rate and Rhythm: Regular  rhythm.     Heart sounds: Normal heart sounds.  Pulmonary:     Effort: Pulmonary effort is normal. No respiratory distress.     Breath sounds: Normal breath sounds. No wheezing, rhonchi or rales.  Skin:    General: Skin is warm and dry.  Neurological:     Mental Status: He is alert.  Psychiatric:        Speech: Speech normal.        Behavior: Behavior normal.       Assessment & Plan:   Problem List Items Addressed This Visit       Cardiovascular and Mediastinum   HTN (hypertension)    Slightly elevated today.  Discussed recent weight gain.  Patient will focus on weight loss, low glycemic diet.  We will make changes to antihypertensive regimen if blood pressure remains elevated at follow-up.  Continue losartan 100 mg        Endocrine   Type 2 diabetes mellitus with hyperglycemia, without long-term current use of insulin ([place])  - Primary    Lab Results  Component Value Date   HGBA1C 7.2 (A) 02/22/2021  ).  Discussed greater focus on dietary changes and low glycemic diet.  He politely declines referral to nutritionist at this time.  If A1c at follow-up remains greater than 6.5, we will stop Januvia and start GLP-1 agonist.      Relevant Orders   POCT HgB A1C (Completed)   Comprehensive metabolic panel   Lipid panel     Other   HLD (hyperlipidemia)    Chronic, stable.  Pending lipid panel.  Continue Crestor 73m      Other Visit Diagnoses     Need for immunization against influenza       Relevant Orders   Flu Vaccine QUAD 635moM (Fluarix, Fluzone & Alfiuria Quad PF) (Completed)        I am having ChIgnacia Palmaaintain his blood glucose meter kit and supplies, vitamin B-12, aspirin EC, Ergocalciferol, metFORMIN, losartan, rosuvastatin, and Januvia.   No orders of the defined types were placed in this encounter.   Return precautions given.   Risks, benefits, and alternatives of the medications and treatment plan prescribed today were discussed, and patient expressed understanding.   Education regarding symptom management and diagnosis given to patient on AVS.  Continue to follow with ArBurnard HawthorneFNP for routine health maintenance.   ChIgnacia Palmand I agreed with plan.   [name]

## 2021-02-23 ENCOUNTER — Telehealth: Payer: Self-pay

## 2021-02-23 NOTE — Telephone Encounter (Signed)
LMTCB for lab results.  

## 2021-02-23 NOTE — Telephone Encounter (Signed)
Pt returning call

## 2021-02-23 NOTE — Telephone Encounter (Signed)
See result note.  

## 2021-02-24 NOTE — Telephone Encounter (Signed)
I have spoken with patient. See result note.  

## 2021-02-24 NOTE — Telephone Encounter (Signed)
Pt returning call for lab results  

## 2021-04-29 ENCOUNTER — Telehealth: Payer: Self-pay

## 2021-04-29 ENCOUNTER — Telehealth: Payer: Self-pay | Admitting: Family

## 2021-04-29 NOTE — Telephone Encounter (Signed)
I called LM to PLEASE call back or go to UC tonight as message that eye was closed with no reasoning was concerning. Route back to me so I can continue to try to reach patient.

## 2021-04-29 NOTE — Telephone Encounter (Signed)
Yes thank you for calling him he should be seen UC/ ED immediately for this given no reason for eye closure. Concerning for stroke or Bells Palsy.

## 2021-04-30 ENCOUNTER — Emergency Department
Admission: EM | Admit: 2021-04-30 | Discharge: 2021-04-30 | Disposition: A | Discharge status: Home / Self Care | Payer: BC Managed Care – PPO | Attending: Emergency Medicine | Admitting: Emergency Medicine

## 2021-04-30 ENCOUNTER — Emergency Department: Payer: BC Managed Care – PPO

## 2021-04-30 ENCOUNTER — Encounter: Payer: Self-pay | Admitting: Emergency Medicine

## 2021-04-30 ENCOUNTER — Other Ambulatory Visit: Payer: Self-pay

## 2021-04-30 DIAGNOSIS — R519 Headache, unspecified: Secondary | ICD-10-CM | POA: Insufficient documentation

## 2021-04-30 DIAGNOSIS — H5712 Ocular pain, left eye: Secondary | ICD-10-CM | POA: Insufficient documentation

## 2021-04-30 DIAGNOSIS — H02402 Unspecified ptosis of left eyelid: Secondary | ICD-10-CM | POA: Diagnosis present

## 2021-04-30 LAB — CBC WITH DIFFERENTIAL/PLATELET
Abs Immature Granulocytes: 0.04 10*3/uL (ref 0.00–0.07)
Basophils Absolute: 0.1 10*3/uL (ref 0.0–0.1)
Basophils Relative: 1 %
Eosinophils Absolute: 0.3 10*3/uL (ref 0.0–0.5)
Eosinophils Relative: 3 %
HCT: 44.6 % (ref 39.0–52.0)
Hemoglobin: 14.5 g/dL (ref 13.0–17.0)
Immature Granulocytes: 0 %
Lymphocytes Relative: 18 %
Lymphs Abs: 1.8 10*3/uL (ref 0.7–4.0)
MCH: 28.5 pg (ref 26.0–34.0)
MCHC: 32.5 g/dL (ref 30.0–36.0)
MCV: 87.6 fL (ref 80.0–100.0)
Monocytes Absolute: 0.9 10*3/uL (ref 0.1–1.0)
Monocytes Relative: 9 %
Neutro Abs: 7.3 10*3/uL (ref 1.7–7.7)
Neutrophils Relative %: 69 %
Platelets: 225 10*3/uL (ref 150–400)
RBC: 5.09 MIL/uL (ref 4.22–5.81)
RDW: 12.8 % (ref 11.5–15.5)
WBC: 10.4 10*3/uL (ref 4.0–10.5)
nRBC: 0 % (ref 0.0–0.2)

## 2021-04-30 LAB — COMPREHENSIVE METABOLIC PANEL
ALT: 20 U/L (ref 0–44)
AST: 27 U/L (ref 15–41)
Albumin: 4.5 g/dL (ref 3.5–5.0)
Alkaline Phosphatase: 56 U/L (ref 38–126)
Anion gap: 6 (ref 5–15)
BUN: 11 mg/dL (ref 6–20)
CO2: 29 mmol/L (ref 22–32)
Calcium: 9.7 mg/dL (ref 8.9–10.3)
Chloride: 102 mmol/L (ref 98–111)
Creatinine, Ser: 0.96 mg/dL (ref 0.61–1.24)
GFR, Estimated: >60 mL/min (ref >60)
Glucose, Bld: 87 mg/dL (ref 70–99)
Potassium: 4.1 mmol/L (ref 3.5–5.1)
Sodium: 137 mmol/L (ref 135–145)
Total Bilirubin: 0.6 mg/dL (ref 0.3–1.2)
Total Protein: 8.3 g/dL — ABNORMAL HIGH (ref 6.5–8.1)

## 2021-04-30 LAB — SEDIMENTATION RATE: Sed Rate: 6 mm/hr (ref 0–20)

## 2021-04-30 LAB — C-REACTIVE PROTEIN: CRP: <0.5 mg/dL (ref <1.0)

## 2021-04-30 MED ORDER — IOHEXOL 350 MG/ML SOLN
75.0000 mL | Freq: Once | INTRAVENOUS | Status: AC | PRN
  Administered 2021-04-30: 75 mL via INTRAVENOUS
  Filled 2021-04-30: qty 75

## 2021-04-30 NOTE — Telephone Encounter (Signed)
Patient's step-dad called back after learning our concerns & they are on their way to [facility] walk-in. I had advised there bc it is next to ED.

## 2021-04-30 NOTE — Telephone Encounter (Signed)
Pt called in stating that the walk-in clinic told him they could not do anything for his eye and that he was headed to the ED right now

## 2021-04-30 NOTE — Telephone Encounter (Signed)
Just FYI I spoke with patient's mother as well as him. His mom asked that I try to get in touch with patient & I made her aware of providers concerns. She really did not want to go to UC or ED with patient & she did not want me to schedule the 7p appointment for UC across the street. She did say that this has happened before with the opposite eye & he went to the eye doctor but didn't know the cause then either. She stated that it just opened back up on it's own. I did call & was able to speak with patient who was hard to understand. He was also not sure if he was willing to go to UC even after I explained concerns of stroke or bells palsy. It sounded like patient stated that he has to be somewhere at 1? I again asked if he would please be seen before Monday & situation could be dangerous? He said that he would call his mother & I asked that he please let me know if he decided to go to UC.

## 2021-04-30 NOTE — ED Triage Notes (Signed)
Seen by eye doctor on Wednesday for left eye lid closed over eye.   Unable to find anything wrong.  Unsure as to what was wrong.   Referred to primary care.    Patient states prior to left eye lid closing, felt pain to left forehead.  Left eye lid closed.  Patient unable to open left eye without assist.  PERRLA.  States vision is more blurred to left eye.

## 2021-04-30 NOTE — ED Provider Notes (Signed)
[facility] Provider Note  Patient Contact: 8:56 PM (approximate)   History   Eye Problem   HPI  Joseph Davis is a 51 y.o. male presents to the emergency department with left eyelid ptosis.  Patient states that 3 weeks ago he had a left-sided headache and then noticed weakening of the left upper eyelid.  Patient states that he can no longer open his eyelid at all.  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  He reports that his current symptoms feel very similar to when he had right-sided 3rd nerve palsy in the past.  He denies nausea, vomiting, falls or mechanisms of trauma.  Denies foreign body sensation.  He states that he was evaluated at [facility] with reassuring work-up.  Patient contacted his primary care provider who referred him to the emergency department for further care and management.      Physical Exam   Triage Vital Signs: ED Triage Vitals  Enc Vitals Group     BP 04/30/21 1422 (!) 152/88     Pulse Rate 04/30/21 1422 91     Resp 04/30/21 1422 16     Temp 04/30/21 1422 99.1 F (37.3 C)     Temp Source 04/30/21 1422 Oral     SpO2 04/30/21 1422 94 %     Weight 04/30/21 1419 204 lb 2.3 oz (92.6 kg)     Height 04/30/21 1419 5\' 9"  (1.753 m)     Head Circumference --      Peak Flow --      Pain Score 04/30/21 1418 1     Pain Loc --      Pain Edu? --      Excl. in [place]? --     Most recent vital signs: Vitals:   04/30/21 1422 04/30/21 1740  BP: (!) 152/88 135/81  Pulse: 91 79  Resp: 16 16  Temp: 99.1 F (37.3 C)   SpO2: 94% 97%     General: Alert and in no acute distress. Eyes:  PERRL. EOMI. left pupil appears slightly larger than the right. Head: No acute traumatic findings ENT:      Ears: TMs are pearly.      Nose: No congestion/rhinnorhea.      Mouth/Throat: Mucous membranes are moist. Neck: No  stridor. No cervical spine tenderness to palpation. Cardiovascular:  Good peripheral perfusion Respiratory: Normal respiratory effort without tachypnea or retractions. Lungs CTAB. Good air entry to the bases with no decreased or absent breath sounds. Gastrointestinal: Bowel sounds 4 quadrants. Soft and nontender to palpation. No guarding or rigidity. No palpable masses. No distention. No CVA tenderness. Musculoskeletal: Full range of motion to all extremities.  Neurologic:  No gross focal neurologic deficits are appreciated.  Skin:   No rash noted Other:   ED Results / Procedures / Treatments   Labs (all labs ordered are listed, but only abnormal results are displayed) Labs Reviewed  COMPREHENSIVE METABOLIC PANEL - Abnormal; Notable for the following components:      Result Value   Total Protein 8.3 (*)    All other components within normal limits  CBC WITH DIFFERENTIAL/PLATELET  SEDIMENTATION RATE  URINALYSIS, COMPLETE (UACMP) WITH MICROSCOPIC  RPR  C-REACTIVE PROTEIN       RADIOLOGY  I personally viewed and evaluated these images as part of my medical decision  making, as well as reviewing the written report by the radiologist.  ED Provider Interpretation: CTA of the head was personally reviewed by me.  No evidence of space-occupying lesion or other acute abnormality.     MEDICATIONS ORDERED IN ED: Medications  iohexol (OMNIPAQUE) 350 MG/ML injection 75 mL (75 mLs Intravenous Contrast Given 04/30/21 1915)     IMPRESSION / MDM / ASSESSMENT AND PLAN / ED COURSE  I reviewed the triage vital signs and the nursing notes.                              Differential diagnosis includes, but is not limited to, space-occupying lesion, posterior communicating artery aneurysm, neurosyphilis...  Assessment and plan Eyelid Ptosis:  52 year old male presents to the emergency department with severe left eyelid ptosis.  Vital signs are reassuring at triage.  On physical exam,  left pupil did appear larger than the right.  Patient was unable to hold his left eye open at all.  I reviewed MR angio head and MR brain without contrast from July 2020.  Discussed patient case with attending Dr. Kerman Passey and we agreed that given asymmetrical pupil, CTA was warranted.  I personally reviewed CTA and there was no evidence of acute abnormality.  CBC, CMP and sed rate were within reference range.  RPR still in process.  Patient was given a referral to neurology.  Given reassuring work-up, I feel that patient is appropriate for outpatient follow-up.  As indicated in HPI, patient has already been evaluated by ophthalmology.  Return precautions were given to return with new or worsening symptoms.  All patient questions were answered.     FINAL CLINICAL IMPRESSION(S) / ED DIAGNOSES   Final diagnoses:  Left eye pain     Rx / DC Orders   ED Discharge Orders     None        Note:  This document was prepared using Dragon voice recognition software and may include unintentional dictation errors.   Vallarie M[place], Hershal Coria 04/30/21 2104    Harvest Dark, MD 05/06/21 1318

## 2021-04-30 NOTE — ED Notes (Signed)
Pt to ED for L eye problem. Since about 7 days ago, L eye lid began to droop and close and by the end of the day, eyelid was closed completely and pt could not open L eye voluntarily. Pt had recently taken aspirin for pain (about 2 weeks ago) for facial pain, above nose and above L eye. L eyelid is closed. Pt only able to open L eye by using hand to pull eyelid open.  Saw eye specialist 2 days ago who could could not find anything wrong with L eye, except that L eye unable to track moving target past midline toward R side. Vision in L eye is also more blurry than R.   This nurse examined pt and noted same. Pt is unable to follow moving finger past midline toward R side, using L eye.   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  Pt complains of intermittent pain on forehead. Describes pain as achy.   Pt also has Bells palsy and hx shingles on face.

## 2021-04-30 NOTE — Discharge Instructions (Signed)
Your CT angio was reassuring. Please make follow-up appointment with neurology.

## 2021-05-01 LAB — RPR: RPR Ser Ql: NONREACTIVE

## 2021-05-03 ENCOUNTER — Ambulatory Visit (INDEPENDENT_AMBULATORY_CARE_PROVIDER_SITE_OTHER): Payer: BC Managed Care – PPO | Admitting: Adult Health

## 2021-05-03 ENCOUNTER — Other Ambulatory Visit: Payer: Self-pay

## 2021-05-03 ENCOUNTER — Encounter: Payer: Self-pay | Admitting: Adult Health

## 2021-05-03 VITALS — BP 158/80 | HR 93 | Temp 98.2°F | Resp 18 | Ht 69.0 in | Wt 192.2 lb

## 2021-05-03 DIAGNOSIS — H02402 Unspecified ptosis of left eyelid: Secondary | ICD-10-CM | POA: Diagnosis not present

## 2021-05-03 DIAGNOSIS — H5712 Ocular pain, left eye: Secondary | ICD-10-CM | POA: Diagnosis not present

## 2021-05-03 DIAGNOSIS — R29898 Other symptoms and signs involving the musculoskeletal system: Secondary | ICD-10-CM | POA: Diagnosis not present

## 2021-05-03 DIAGNOSIS — R519 Headache, unspecified: Secondary | ICD-10-CM | POA: Diagnosis not present

## 2021-05-03 DIAGNOSIS — R260 Ataxic gait: Secondary | ICD-10-CM

## 2021-05-03 LAB — HEMOGLOBIN A1C: Hemoglobin A1C: 6.3

## 2021-05-03 NOTE — Assessment & Plan Note (Signed)
[facility] emergency room now parents will drive he has significant dizziness and lower extremity weakness since yesterday this is new.left eye drooping and pain, unable to open left eye was evaluated at [facility] without any finding. CTA angiogram negative at [facility], however he has new weakness and staggering with walking today in office that he reports started yesterday.Discussed in detail that a higher level of care is needed NOW and parents and patient agree to go to [facility] main campus. Concerened for differentials of Stroke, Guillain Barre, ALS, Viral illness, needs MRI and likely spinal tap as well as further intensive work up.  °Blood pressure noted to be still mildly elevated today. No change in therapy follow up after hospital advised.  °Recheck at [facility] also encouraged post hospital.  ° °ER now.  °

## 2021-05-03 NOTE — Progress Notes (Addendum)
Acute Office Visit  Subjective:    Patient ID: Joseph Davis, male    DOB: 1970-06-25, 51 y.o.   MRN: [id number]  Chief Complaint  Patient presents with   Acute Visit    Left eye Paralysis and new onset weakness to bilateral legs.    HPI Patient is in today for follow up to ED visit on 04/30/20, he was called by a Judson Roch CMA in our office and sent to ED as provider had advised for concerns of left eye that would not open , he had been seen at [facility] prior and was sent to PCP scheduled for today 05/03/21.  History of headache left sided with left eyelid that would not open at all for over three weeks.  Blood pressure was elevated in the ER  at 152/87 Denies any recent immunizations or medication changes.  Last immunization noted in chart was 02/22/21 influenza.  He was given a referral to neurology from ER. Denies any shortness of breath or breathing issues.  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   CBC, CMP, RPR, Sed rate and CT angio head with/  without contrast only total protein was elevated on CBC. Left pupil was slightly larger than right.   Reviewed imaging.  IMPRESSION: CT HEAD: 1. No acute intracranial abnormality. 2. 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 3. 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 CTA HEAD: Negative CTA of the head. No large vessel occlusion, hemodynamically significant stenosis, or other acute vascular abnormality. No aneurysm.reviewed MR angio head and MR brain without contrast from July 2020.  Discussed patient case with attending Dr. Kerman Passey and we agreed that given asymmetrical pupil, CTA was warranted Electronically Signed   By: Jeannine Boga M.D.   On: 04/30/2021 20:21   He reports he has some mild dizziness since yesterday, left eyelid is still completely closed. He reports his walking has not been correct today he is having  trouble walking.  He still is unable to open his left eye.   Patient  denies any fever,chills, rash, chest pain, shortness of breath, nausea, vomiting, or diarrhea. Denies dizziness, lightheadedness, pre syncopal or syncopal episodes.  Denies  any trauma  Pertinenet history of Bells Palsy in 2013. I see the history of stroke on PMH but no date. Patient and step father and mother unsure of date.  Past Medical History:  Diagnosis Date   Bell's palsy    Stroke [facility])     Past Medical History:  Diagnosis Date   Bell's palsy    Stroke [facility])     History reviewed. No pertinent surgical history.  Family History  Problem Relation Age of Onset   Diabetes Mother    Diabetes Father    Thyroid cancer Maternal Grandmother     Social History   Socioeconomic History   Marital status: Single    Spouse name: Not on file   Number of children: Not on file   Years of education: Not on file   Highest education level: Not on file  Occupational History   Not on file  Tobacco Use   Smoking status: Never   Smokeless tobacco: Never  Vaping Use   Vaping Use: Never used  Substance and Sexual Activity   Alcohol use: Never   Drug use: Never   Sexual activity: Not on file  Other Topics Concern   Not on file  Social History Narrative  Not on file   Social Determinants of Health   Financial Resource Strain: Not on file  Food Insecurity: Not on file  Transportation Needs: Not on file  Physical Activity: Not on file  Stress: Not on file  Social Connections: Not on file  Intimate Partner Violence: Not on file    Outpatient Medications Prior to Visit  Medication Sig Dispense Refill   aspirin EC 81 MG tablet Take 81 mg by mouth daily.     blood glucose meter kit and supplies Dispense based on patient and insurance preference. Use up to four times daily as directed. (FOR ICD-10 E10.9, E11.9).  Check blood sugar up to 4 times per day. 1 each 0   Ergocalciferol 50 MCG (2000 UT) CAPS Take  0.5 capsules by mouth daily. 60 capsule 2   JANUVIA 100 MG tablet Take 1 tablet (100 mg total) by mouth daily. 90 tablet 0   losartan (COZAAR) 100 MG tablet Take 1 tablet (100 mg total) by mouth daily. 90 tablet 1   metFORMIN (GLUCOPHAGE) 1000 MG tablet Take 1 tablet (1,000 mg total) by mouth 2 (two) times daily with a meal. 180 tablet 3   rosuvastatin (CRESTOR) 10 MG tablet Take 1 tablet (10 mg total) by mouth daily. 90 tablet 2   vitamin B-12 (V-R VITAMIN B-12) 500 MCG tablet Take 1 tablet (500 mcg total) by mouth daily. 30 tablet 4   No facility-administered medications prior to visit.    No Known Allergies  Review of Systems  Constitutional:  Positive for activity change and fatigue. Negative for appetite change, chills, diaphoresis, fever and unexpected weight change.  Eyes:  Positive for pain (unable to open left eyelid for three weeks.).  Respiratory: Negative.    Cardiovascular: Negative.   Gastrointestinal: Negative.   Genitourinary: Negative.   Musculoskeletal:  Positive for gait problem. Negative for arthralgias, back pain, joint swelling, myalgias, neck pain and neck stiffness.  Neurological:  Positive for weakness (new onset of weakness left lower extremity, dizziness since yesterday with 05/02/20. He has has left eyelid drooping for over three weeks. these other symptoms are new and very concerning.) and headaches. Negative for facial asymmetry.  Hematological: Negative.   Psychiatric/Behavioral: Negative.        Objective:   Blood pressure (!) 158/80, pulse 93, temperature 98.2 F (36.8 C), temperature source Oral, resp. rate 18, height 5' 9" (1.753 m), weight 192 lb 4 oz (87.2 kg).  Physical Exam Vitals reviewed.  Constitutional:      Appearance: He is well-developed.     Comments: Speech is normal.    HENT:     Head: Normocephalic and atraumatic.     Jaw: There is normal jaw occlusion.     Right Ear: External ear normal. There is impacted cerumen.     Left Ear:  External ear normal. There is impacted cerumen.     Ears:     Comments: Impacted soft cerumen unable to visualize further to examine. No pain.    Nose: Nose normal.     Right Sinus: No maxillary sinus tenderness or frontal sinus tenderness.     Left Sinus: No maxillary sinus tenderness or frontal sinus tenderness.     Mouth/Throat:     Mouth: Mucous membranes are moist.     Pharynx: Uvula midline. No oropharyngeal exudate or posterior oropharyngeal erythema.     Tonsils: No tonsillar abscesses.  Eyes:     Conjunctiva/sclera: Conjunctivae normal.     Right eye: Right conjunctiva  is not injected. No exudate.    Left eye: Left conjunctiva is not injected. No exudate.    Pupils: Pupils are equal, round, and reactive to light.     Comments: Left eyelid is completely shut, he can not open.  Smile is equal.  Left pupil smaller than right and left only slightly reactive to light.   Neck:     Comments: Sitting in wheelchair accompanied by mother and step father.  Cardiovascular:     Rate and Rhythm: Normal rate and regular rhythm.     Pulses: Normal pulses.     Heart sounds: Normal heart sounds.  Pulmonary:     Effort: Pulmonary effort is normal. No respiratory distress.     Breath sounds: Normal breath sounds. No stridor. No wheezing, rhonchi or rales.     Comments: No dyspnea in room.  Chest:     Chest wall: No tenderness.  Musculoskeletal:     Cervical back: Full passive range of motion without pain, normal range of motion and neck supple.  Lymphadenopathy:     Head:     Right side of head: No submental, submandibular, tonsillar, preauricular, posterior auricular or occipital adenopathy.     Left side of head: No submental, submandibular, tonsillar, preauricular, posterior auricular or occipital adenopathy.     Cervical: No cervical adenopathy.     Right cervical: No superficial, deep or posterior cervical adenopathy.    Left cervical: No superficial, deep or posterior cervical  adenopathy.  Skin:    General: Skin is warm and dry.  Neurological:     Mental Status: He is alert.     Cranial Nerves: No cranial nerve deficit or facial asymmetry (left eye lid shut had to be manually opened for exam).     Sensory: Sensation is intact. No sensory deficit.     Motor: Weakness present.     Coordination: Coordination abnormal. Rapid alternating movements normal.     Gait: Gait abnormal.     Deep Tendon Reflexes:     Reflex Scores:      Bicep reflexes are 2+ on the right side and 2+ on the left side.      Patellar reflexes are 1+ on the right side and 1+ on the left side.    Comments: Grip right greater than left . Gait weak and unable to walk without grabbing objects in room. Unable to perform  finger-to-nose. Left lower extremity weaker than right but he does have weakness in both.  gait is very shuffled.  Intellectual challenged.  Mom reports to provider that he does not usually walk like this and this is new since yesterday.   Psychiatric:        Speech: Speech normal.        Behavior: Behavior normal.  Further neurological exam was not performed due to unable to stabilize standing and weakness high risk for falls.  BP (!) 158/80 (BP Location: Left Arm, Patient Position: Sitting, Cuff Size: Normal)    Pulse 93    Temp 98.2 F (36.8 C) (Oral)    Resp 18    Ht 5' 9" (1.753 m)    Wt 192 lb 4 oz (87.2 kg)    SpO2 98%    BMI 28.39 kg/m  Wt Readings from Last 3 Encounters:  05/03/21 192 lb 4 oz (87.2 kg)  04/30/21 204 lb 2.3 oz (92.6 kg)  02/22/21 204 lb 3.2 oz (92.6 kg)    Health Maintenance Due  Topic Date Due  Zoster Vaccines- Shingrix (1 of 2) Never done   COVID-19 Vaccine (3 - Booster for Moderna series) 12/24/2019   FOOT EXAM  04/20/2021    There are no preventive care reminders to display for this patient.   Lab Results  Component Value Date   TSH 0.78 11/06/2018   Lab Results  Component Value Date   WBC 10.4 04/30/2021   HGB 14.5 04/30/2021   HCT  44.6 04/30/2021   MCV 87.6 04/30/2021   PLT 225 04/30/2021   Lab Results  Component Value Date   NA 137 04/30/2021   K 4.1 04/30/2021   CO2 29 04/30/2021   GLUCOSE 87 04/30/2021   BUN 11 04/30/2021   CREATININE 0.96 04/30/2021   BILITOT 0.6 04/30/2021   ALKPHOS 56 04/30/2021   AST 27 04/30/2021   ALT 20 04/30/2021   PROT 8.3 (H) 04/30/2021   ALBUMIN 4.5 04/30/2021   CALCIUM 9.7 04/30/2021   ANIONGAP 6 04/30/2021   GFR 81.00 02/22/2021   Lab Results  Component Value Date   CHOL 84 02/22/2021   Lab Results  Component Value Date   HDL 34.70 (L) 02/22/2021   Lab Results  Component Value Date   LDLCALC 39 02/22/2021   Lab Results  Component Value Date   TRIG 52.0 02/22/2021   Lab Results  Component Value Date   CHOLHDL 2 02/22/2021   Lab Results  Component Value Date   HGBA1C 7.2 (A) 02/22/2021       Assessment & Plan:   Problem List Items Addressed This Visit       Nervous and Auditory   Weakness of left lower extremity - Primary    [facility] emergency room now parents will drive he has significant dizziness and lower extremity weakness since yesterday this is new.left eye drooping and pain, unable to open left eye was evaluated at [facility] center without any finding. [facility]A angi[facility] negative at [facility], however he has new weakness and staggering with walking today in office that he reports started yesterday.Discussed in detail that a higher level of care is needed NOW and parents and patient agree to go to [facility] main campus. Concerened for differentials of Stroke, Guillain Barre, ALS, Viral illness, needs MRI and likely spinal tap as well as further intensive work up.  Blood pressure noted to be still mildly elevated today. No change in therapy follow up after hospital advised.  Recheck at [facility] eye center also encouraged post hospital.   ER now.       Relevant Orders   Ambulatory referral to Neurology     Other   Ptosis of eyelid, left    [facility] emergency room  now parents will drive he has significant dizziness and lower extremity weakness since yesterday this is new.left eye drooping and pain, unable to open left eye was evaluated at [facility] center without any finding. CTA angiogram negative at [facility], however he has new weakness and staggering with walking today in office that he reports started yesterday.Discussed in detail that a higher level of care is needed NOW and parents and patient agree to go to [facility] main campus. Concerened for differentials of Stroke, Guillain Barre, ALS, Viral illness, needs MRI and likely spinal tap as well as further intensive work up.  Blood pressure noted to be still mildly elevated today. No change in therapy follow up after hospital advised.  Recheck at [facility] eye center also encouraged post hospital.   ER now.       Relevant  Orders   Ambulatory referral to Neurology   Eye pain, left   Relevant Orders   Ambulatory referral to Neurology   Nonintractable headache    [facility] emergency room now parents will drive he has significant dizziness and lower extremity weakness since yesterday this is new.left eye drooping and pain, unable to open left eye was evaluated at [facility] center without any finding. CTA angiogram negative at [facility], however he has new weakness and staggering with walking today in office that he reports started yesterday.Discussed in detail that a higher level of care is needed NOW and parents and patient agree to go to [facility] main campus. Concerened for differentials of Stroke, Guillain Barre, ALS, Viral illness, needs MRI and likely spinal tap as well as further intensive work up.  Blood pressure noted to be still mildly elevated today. No change in therapy follow up after hospital advised.  Recheck at [facility] eye center also encouraged post hospital.   ER now.       Relevant Orders   Ambulatory referral to Neurology   Staggering gait    [facility] emergency room now parents will drive he has significant  dizziness and lower extremity weakness since yesterday this is new.left eye drooping and pain, unable to open left eye was evaluated at [facility] center without any finding. CT[facility] angiogram negative at [facility], however he has new weakness and staggering with walking today in office that he reports started yesterday.Discussed in detail that a higher level of care is needed NOW and parents and patient agree to go to [facility] main campus. Concerened for differentials of Stroke, Guillain Barre, ALS, Viral illness, needs MRI and likely spinal tap as well as further intensive work up.  Blood pressure noted to be still mildly elevated today. No change in therapy follow up after hospital advised.  Recheck at [facility] eye center also encouraged post hospital.   ER now.       Relevant Orders   Ambulatory referral to Neurology   [facility] emergency room now parents will drive he has significant dizziness and lower extremity weakness since yesterday this is new.left eye drooping and pain, unable to open left eye was evaluated at [facility] center without any finding. CTA angiogram negative at [facility], however he has new weakness and staggering with walking today in office that he reports started yesterday.Discussed in detail that a higher level of care is needed NOW and parents and patient agree to go to [facility] main campus. Concerened for differentials of Stroke, Guillain Barre, ALS, Viral illness, needs MRI and likely spinal tap as well as further intensive work up.  Blood pressure noted to be still mildly elevated today. No change in therapy follow up after hospital advised.  Recheck at [facility] eye center also encouraged post hospital.   ER now.  Urgent neurology to [facility] was placed today as well as likely needs for future.  Return in about 5 days (around 05/08/2021), or if symptoms worsen or fail to improve, for at any time for any worsening symptoms.  Mom and stepfather and patient all in agreement to go to  [facility] ER NOW.  911 if needed/  Red Flags discussed. The patient was given clear instructions to go to ER or return to medical center if any red flags develop, symptoms do not improve, worsen or new problems develop. They verbalized understanding.    No orders of the defined types were placed in this encounter.    Marcille Buffy, FNP

## 2021-05-03 NOTE — Assessment & Plan Note (Signed)
[facility] emergency room now parents will drive he has significant dizziness and lower extremity weakness since yesterday this is new.left eye drooping and pain, unable to open left eye was evaluated at [facility] without any finding. CTA angiogram negative at [facility], however he has new weakness and staggering with walking today in office that he reports started yesterday.Discussed in detail that a higher level of care is needed NOW and parents and patient agree to go to [facility] main campus. Concerened for differentials of Stroke, Guillain Barre, ALS, Viral illness, needs MRI and likely spinal tap as well as further intensive work up.  °Blood pressure noted to be still mildly elevated today. No change in therapy follow up after hospital advised.  °Recheck at [facility] also encouraged post hospital.  ° °ER now.  °

## 2021-05-03 NOTE — Assessment & Plan Note (Signed)
[facility] emergency room now parents will drive he has significant dizziness and lower extremity weakness since yesterday this is new.left eye drooping and pain, unable to open left eye was evaluated at [facility] without any finding. CTA angiogram negative at [facility], however he has new weakness and staggering with walking today in office that he reports started yesterday.Discussed in detail that a higher level of care is needed NOW and parents and patient agree to go to [facility] main campus. Concerened for differentials of Stroke, Guillain Barre, ALS, Viral illness, needs MRI and likely spinal tap as well as further intensive work up.  °Blood pressure noted to be still mildly elevated today. No change in therapy follow up after hospital advised.  °Recheck at [facility] also encouraged post hospital.  ° °ER now.  °

## 2021-05-03 NOTE — Patient Instructions (Signed)
[facility] emergency room now parents will drive he has significant dizziness and lower extremity weakness since yesterday this is new.left eye drooping and pain, unable to open left eye was evaluated at [facility] center without any finding. CTA angiogram negative at [facility], however he has new weakness and staggering with walking today in office that he reports started yesterday.  Weakness Weakness is a lack of strength. You may feel weak all over your body (generalized), or you may feel weak in one part of your body (focal). There are many potential causes of weakness. Sometimes, the cause of your weakness may not be known. Some causes of weakness can be serious, so it is important to see your doctor. Follow these instructions at home: Activity Rest as needed. Try to get enough sleep. Most adults need 7-8 hours of sleep each night. Talk to your doctor about how much sleep you need each night. Do exercises, such as arm curls and leg raises, for 30 minutes at least 2 days a week or as told by your doctor. Think about working with a physical therapist or trainer to help you get stronger. General instructions  Take over-the-counter and prescription medicines only as told by your doctor. Eat a healthy, well-balanced diet. This includes: Proteins to build muscles, such as lean meats and fish. Fresh fruits and vegetables. Carbohydrates to boost energy, such as whole grains. Drink enough fluid to keep your pee (urine) pale yellow. Keep all follow-up visits as told by your doctor. This is important. Contact a doctor if: Your weakness does not get better or it gets worse. Your weakness affects your ability to: Think clearly. Do your normal daily activities. Get help right away if you: Have sudden weakness on one side of your face or body. Have chest pain. Have trouble breathing or shortness of breath. Have problems with your vision. Have trouble talking or swallowing. Have trouble standing or  walking. Are light-headed. Pass out (lose consciousness). Summary Weakness is a lack of strength. You may feel weak all over your body or just in one part of your body. There are many potential causes of weakness. Sometimes, the cause of your weakness may not be known. Rest as needed, and try to get enough sleep. Most adults need 7-8 hours of sleep each night. Eat a healthy, well-balanced diet. This information is not intended to replace advice given to you by your health care provider. Make sure you discuss any questions you have with your health care provider. Document Revised: 11/01/2017 Document Reviewed: 11/01/2017 Elsevier Patient Education  2022 [employer].

## 2021-05-03 NOTE — Assessment & Plan Note (Signed)
[facility] emergency room now parents will drive he has significant dizziness and lower extremity weakness since yesterday this is new.left eye drooping and pain, unable to open left eye was evaluated at [facility] center without any finding. CTA angiogram negative at [facility], however he has new weakness and staggering with walking today in office that he reports started yesterday.Discussed in detail that a higher level of care is needed NOW and parents and patient agree to go to [facility] main campus. Concerened for differentials of Stroke, Guillain Barre, ALS, Viral illness, needs MRI and likely spinal tap as well as further intensive work up.  Blood pressure noted to be still mildly elevated today. No change in therapy follow up after hospital advised.  Recheck at [facility] eye center also encouraged post hospital.   ER now.

## 2021-05-05 ENCOUNTER — Telehealth: Payer: Self-pay | Admitting: *Deleted

## 2021-05-05 NOTE — Telephone Encounter (Signed)
Per patient family when I enquired could I speak with patient was advised he is in [facility] hospital for the last 3 days but then family member advised that's none of our business.

## 2021-05-05 NOTE — Telephone Encounter (Signed)
-----   Message from Doreen Beam, [place] sent at 05/04/2021  9:52 AM EST ----- Looks as if he still has not been taken to [facility] as advised to ER it is of utmost importance. Mother and step dad were advised to take him yesterday can we make a follow up call to them to see if they are there waiting. I do not see a check in or note at [facility] and very concerned.

## 2021-05-07 NOTE — Telephone Encounter (Signed)
Noted He has an appt with 05/11/21

## 2021-05-11 ENCOUNTER — Ambulatory Visit: Payer: BC Managed Care – PPO | Admitting: Family

## 2021-05-26 ENCOUNTER — Ambulatory Visit: Payer: BC Managed Care – PPO | Admitting: Family

## 2021-06-08 NOTE — Progress Notes (Signed)
Pt was hard to understand over the phone. He is having home health & physical therapy. As of now 5 more times bc that is what insurance will pay for. He is unsure if that will be continued unless insurance will agree to cover.  ?

## 2021-06-09 ENCOUNTER — Encounter: Payer: Self-pay | Admitting: Family

## 2021-06-09 ENCOUNTER — Telehealth: Payer: Self-pay | Admitting: Family

## 2021-06-09 ENCOUNTER — Other Ambulatory Visit: Payer: Self-pay | Admitting: Family

## 2021-06-09 ENCOUNTER — Other Ambulatory Visit: Payer: Self-pay

## 2021-06-09 ENCOUNTER — Ambulatory Visit (INDEPENDENT_AMBULATORY_CARE_PROVIDER_SITE_OTHER): Payer: BC Managed Care – PPO | Admitting: Family

## 2021-06-09 VITALS — BP 130/82 | HR 86 | Temp 98.3°F | Ht 68.0 in | Wt 199.2 lb

## 2021-06-09 DIAGNOSIS — Z23 Encounter for immunization: Secondary | ICD-10-CM

## 2021-06-09 DIAGNOSIS — E1165 Type 2 diabetes mellitus with hyperglycemia: Secondary | ICD-10-CM | POA: Diagnosis not present

## 2021-06-09 DIAGNOSIS — H4901 Third [oculomotor] nerve palsy, right eye: Secondary | ICD-10-CM | POA: Diagnosis not present

## 2021-06-09 DIAGNOSIS — H4903 Third [oculomotor] nerve palsy, bilateral: Secondary | ICD-10-CM

## 2021-06-09 DIAGNOSIS — H4902 Third [oculomotor] nerve palsy, left eye: Secondary | ICD-10-CM | POA: Diagnosis not present

## 2021-06-09 DIAGNOSIS — R899 Unspecified abnormal finding in specimens from other organs, systems and tissues: Secondary | ICD-10-CM

## 2021-06-09 NOTE — Telephone Encounter (Signed)
Appointment scheduled for July 27th, 2023 Nothing earlier than that unless someone cancels. Per front office staff, would like info faxed to them of exactly what is going  in regards to upcoming visit. ?

## 2021-06-09 NOTE — Assessment & Plan Note (Signed)
a1c 6.3 during hospitalization. Continue metformin 1000 mg twice daily, Januvia 100 mg daily ?

## 2021-06-09 NOTE — Telephone Encounter (Signed)
Call  ? ?PT service and ensure it is extended for patient .  ?Would recommend 4-6 weeks ? ?[facility]   ?[address]   ?[address]   ?[place], [place] [address]   ?[phone]   ?

## 2021-06-09 NOTE — Progress Notes (Signed)
? ?Subjective:  ? ? Patient ID: [facility], male    DOB: 12/25/1970, 51 y.o.   MRN: [id number] ? ?CC: [facility] is a 51 y.o. male who presents today for follow up.  ? ?HPI: Accompanied by parents today ? ?Left eye blurry peripheral vision, can vary between clear and blurry. No blurry vision in right eye. Otherwise no Joseph complains today.  ? ?He is doing PT to work on balance and finding helpful.No fall since fall prior to hospitalization 05/03/21.  He would like to extend PT .  ?He is not driving.  ? ?[facility] neurology reached out to patient 05/12/2021 for appointment and he declined ? ? ?DM-compliant with metformin 1000 mg twice daily, Januvia 100 mg daily ? ?Prior h/o shingles right side of face. Desires shingles vaccine  ? ?Presented to [facility] ED 05/03/2021 for left eye ptosis, dizziness and balance impairment.  Admitted and discharged 05/08/2021  ? ?Per chart review [facility] assessment and plan for ocular motor palsy.  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 ? ?MRI brain 05/03/21. No cranial nerve abnormality. Old cerebellar .Abnormal T2 signal within the deep/subcortical white matter of the right frontal lobe and left frontal lobe.  Sequela of cerebral palsy ? ?Negative EBV, CMV, HSV 1 & 2 ?A1c 6.3 ? ? ?losartan changed from 25 mg to 12.5 mg ?Started on vit b1 141m tablet  ? ?HISTORY:  ?Past Medical History:  ?Diagnosis Date  ? Bell's palsy   ? Stroke ([facility]   ? ?No past surgical history on file. ?Family History  ?Problem Relation Age of Onset  ? Diabetes Mother   ? Diabetes Father   ? Thyroid cancer Maternal Grandmother   ? ? ?Allergies: Patient has no known allergies. ?Current Outpatient Medications on File Prior to Visit  ?Medication Sig Dispense Refill  ? aspirin EC 81 MG tablet Take 81 mg by mouth daily.    ? blood glucose meter kit and supplies Dispense based on patient and insurance preference. Use up to four times  daily as directed. (FOR ICD-10 E10.9, E11.9). ? ?Check blood sugar up to 4 times per day. 1 each 0  ? Cholecalciferol 50 MCG (2000 UT) TABS Take 1 capsule by mouth daily.    ? JANUVIA 100 MG tablet Take 1 tablet (100 mg total) by mouth daily. 90 tablet 0  ? losartan (COZAAR) 100 MG tablet Take 1 tablet (100 mg total) by mouth daily. 90 tablet 1  ? metFORMIN (GLUCOPHAGE) 1000 MG tablet Take 1 tablet (1,000 mg total) by mouth 2 (two) times daily with a meal. 180 tablet 3  ? rosuvastatin (CRESTOR) 10 MG tablet Take 1 tablet (10 mg total) by mouth daily. 90 tablet 2  ? thiamine 100 MG tablet Take 1 tablet by mouth daily.    ? vitamin B-12 (CYANOCOBALAMIN) 500 MCG tablet Take 1 tablet by mouth daily.    ? ?No current facility-administered medications on file prior to visit.  ? ? ?Social History  ? ?Tobacco Use  ? Smoking status: Never  ? Smokeless tobacco: Never  ?Vaping Use  ? Vaping Use: Never used  ?Substance Use Topics  ? Alcohol use: Never  ? Drug use: Never  ? ? ?Review of Systems  ?Constitutional:  Negative for chills and fever.  ?Eyes:  Positive for visual disturbance.  ?Respiratory:  Negative for cough.   ?Cardiovascular:  Negative for chest pain and palpitations.  ?Gastrointestinal:  Negative for  nausea and vomiting.  ?Neurological:  Positive for weakness (lower extremity).  ?   ?Objective:  ?  ?BP 130/82 (BP Location: Left Arm, Patient Position: Sitting, Cuff Size: Normal)   Pulse 86   Temp 98.3 ?F (36.8 ?C) (Oral)   Ht _0  (1.727 m)   Wt 199 lb 3.2 oz (90.4 kg)   SpO2 96%   BMI 30.29 kg/m?  ?BP Readings from Last 3 Encounters:  ?06/09/21 130/82  ?05/03/21 (!) 158/80  ?04/30/21 132/80  ? ?Wt Readings from Last 3 Encounters:  ?06/09/21 199 lb 3.2 oz (90.4 kg)  ?05/03/21 192 lb 4 oz (87.2 kg)  ?04/30/21 204 lb 2.3 oz (92.6 kg)  ? ? ?Physical Exam ?Vitals reviewed.  ?Constitutional:   ?   Appearance: He is well-developed.  ?Cardiovascular:  ?   Rate and Rhythm: Regular rhythm.  ?   Heart sounds: Normal  heart sounds.  ?Pulmonary:  ?   Effort: Pulmonary effort is normal. No respiratory distress.  ?   Breath sounds: Normal breath sounds. No wheezing, rhonchi or rales.  ?Skin: ?   General: Skin is warm and dry.  ?Neurological:  ?   Mental Status: He is alert.  ?Psychiatric:     ?   Speech: Speech normal.     ?   Behavior: Behavior normal.  ? ? ?   ?Assessment & Plan:  ? ?Problem List Items Addressed This Visit   ? ?  ? Endocrine  ? Type 2 diabetes mellitus with hyperglycemia, without long-term current use of insulin ([place])  ?  a1c 6.3 during hospitalization. Continue metformin 1000 mg twice daily, Januvia 100 mg daily ?  ?  ?  ? Nervous and Auditory  ? Third (oculomotor) nerve palsy, left eye  ?  Continues to have left eye blurriness. Reviewed hospitalization at [facility].  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  Joseph. Linus Davis advised ordering HIV RNA viral load . I called and spoke with patient;s mother and advised of abnormal HIV screen. Patient scheduled for lab 06/14/21.Referral to neurology in [place] as patient declines neurology in [place] due to driving.  He declines seeing neuro-ophthalmology in [place] as well.  Follow-up appointment with Joseph. Georgette Davis scheduled summer 2023 and we have called out to his office to move up appointment as well as make him aware of vision changes, hospitalization.  Call out to [facility] home health PT to extend as well.  Patient let me know how he is doing.  ?  ?  ? 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   ? ?  ?  ? Relevant Orders  ? Ambulatory referral to Neurology  ? ?Other Visit Diagnoses   ? ? Need for varicella vaccine      ? Relevant Orders  ? Varicella-zoster vaccine IM (Shingrix) (Completed)  ? ?  ? ? ? ?I have discontinued [facility] Ergocalciferol. I am also having him maintain his blood glucose meter kit and supplies, aspirin EC, metFORMIN, losartan, rosuvastatin,  Januvia, Cholecalciferol, vitamin B-12, and thiamine. ? ? ?No orders of the defined types were placed in this encounter. ? ? ?Return precautions given.  ? ?Risks, benefits, and alternatives of the medications and treatment plan prescribed today were discussed, and patient expressed understanding.  ? ?Education regarding symptom management and diagnosis given to patient on AVS. ? ?Continue to follow with Burnard Hawthorne, FNP for routine  health maintenance.  ? ?[name] and I agreed with plan.  ? ?[name], FNP ? ?I have spent 35 minutes with a patient including precharting, exam, reviewing medical records, and discussion plan of care.  ? ? ? ? ?

## 2021-06-09 NOTE — Assessment & Plan Note (Addendum)
Continues to have left eye blurriness. Reviewed hospitalization at [facility].  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  Dr. Linus Salmons advised ordering HIV RNA viral load . I called and spoke with patient;s mother and advised of abnormal HIV screen. Patient scheduled for lab 06/14/21.Referral to neurology in [place] as patient declines neurology in [place] due to driving.  He declines seeing neuro-ophthalmology in [place] as well.  Follow-up appointment with Dr. Georgette Dover scheduled summer 2023 and we have called out to his office to move up appointment as well as make him aware of vision changes, hospitalization.  Call out to [facility] home health PT to extend as well.  Patient let me know how he is doing.  ?

## 2021-06-09 NOTE — Patient Instructions (Signed)
Referral to neurology ?Let us know if you dont hear back within a week in regards to an appointment being scheduled.  ? ?We will also call and move up your appointment with ophthalmology at [facility], Dr Layne Benton.  ? ?Please let me know of any new concerns ?

## 2021-06-09 NOTE — Progress Notes (Signed)
Left Vm to let patient know I scheduled appointment for 11AM  on Mon Mar 6,2023 for labs ?

## 2021-06-09 NOTE — Telephone Encounter (Signed)
Call pts ophthalmologist,Dr Portfilio ? ?How soon can he see patient?  ?Pt hospitalized 04/2021 at [facility] for left eye vision loss , Suspected left cranial nerve III palsy ? ?I saw patient today and he continues to have left eye vision loss. I have placed referral to neurology as well.  ? ? Eye ?[address] [place], [place] [address] ?Phone: [phone] ?

## 2021-06-10 NOTE — Telephone Encounter (Signed)
Spoke with Amy at [facility] and she stated that his  OT and his PT was extended on 05/31/2021 ?

## 2021-06-14 ENCOUNTER — Telehealth: Payer: Self-pay | Admitting: Family

## 2021-06-14 ENCOUNTER — Other Ambulatory Visit: Payer: Self-pay

## 2021-06-14 ENCOUNTER — Other Ambulatory Visit (INDEPENDENT_AMBULATORY_CARE_PROVIDER_SITE_OTHER): Payer: BC Managed Care – PPO

## 2021-06-14 DIAGNOSIS — R899 Unspecified abnormal finding in specimens from other organs, systems and tissues: Secondary | ICD-10-CM

## 2021-06-14 NOTE — Telephone Encounter (Signed)
Patent dropped off paper work for his Medical leave to be completed by [employer]. Patient would like it faxed and please call him, he would copy. It is due by 06/22/2021. ?

## 2021-06-14 NOTE — Telephone Encounter (Signed)
Paper work is up front in Writer. ?

## 2021-06-14 NOTE — Telephone Encounter (Signed)
Printed and faxed to Dr. Linton Flemings ?

## 2021-06-14 NOTE — Telephone Encounter (Signed)
Please print mri brain and mra head 05/03/21 and OV notes from 06/09/21 ?Please make letter for me with the below for me to sign with above printed. We can fax once all together ? ?Dr Alinda Money,  ? ?Hope you are well. Our mutual patient was hospitalized 04/2021 at [facility] for left eye vision loss , Suspected left cranial nerve III palsy.  ? ?I have included neuroimaging and my office notes from 06/09/21. He continues to have blurry vision in his left eye. Your office had indicated his appointment in July 2023 was unable to be moved up.  ? ?Are you able to see him in the next couple of weeks?  I have considered a neuro-ophthalmology referral however patient declined as he did not want to drive to [facility].  A neurology referral is in place currently.  Please let me know if you have any questions and feel free to call me at the office [phone] or my cell phone [phone] . ? ?

## 2021-06-16 LAB — HIV-1 RNA QUANT-NO REFLEX-BLD
HIV 1 RNA Quant: NOT DETECTED Copies/mL
HIV-1 RNA Quant, Log: NOT DETECTED Log cps/mL

## 2021-06-21 NOTE — Telephone Encounter (Signed)
Paperwork has been faxed to [employer]. ?

## 2021-06-22 ENCOUNTER — Telehealth: Payer: Self-pay

## 2021-06-22 NOTE — Telephone Encounter (Signed)
LMTCB to office to get results 

## 2021-06-23 NOTE — Telephone Encounter (Signed)
Patient returned office phone call for lab results. 

## 2021-06-24 NOTE — Telephone Encounter (Signed)
Pt returning call. Pt requesting callback.  °

## 2021-06-24 NOTE — Telephone Encounter (Signed)
LMTCB

## 2021-06-24 NOTE — Telephone Encounter (Signed)
I spoke with patient & he is aware of results.  ?

## 2021-06-24 NOTE — Telephone Encounter (Signed)
Tried calling patient but the home number no one answered and the mobile number.is not his  ?

## 2021-07-01 ENCOUNTER — Telehealth: Payer: Self-pay | Admitting: Family

## 2021-07-01 NOTE — Telephone Encounter (Signed)
Pt came into office to drop off return to work papers to be filled out. Place in colored form. Pt need them back ASAP ?

## 2021-07-02 NOTE — Telephone Encounter (Signed)
I have faxed back to [facility]. ?

## 2021-07-02 NOTE — Telephone Encounter (Signed)
Completed. On your desk.

## 2021-07-28 ENCOUNTER — Other Ambulatory Visit: Payer: Self-pay | Admitting: Family

## 2021-07-28 ENCOUNTER — Telehealth: Payer: Self-pay | Admitting: Family

## 2021-07-28 DIAGNOSIS — I10 Essential (primary) hypertension: Secondary | ICD-10-CM

## 2021-07-28 NOTE — Telephone Encounter (Signed)
Pt called in requesting refill on medication (losartan (COZAAR) 100 MG tablet)... Pt requesting callback  ?

## 2021-07-29 ENCOUNTER — Other Ambulatory Visit: Payer: Self-pay

## 2021-07-29 ENCOUNTER — Telehealth: Payer: Self-pay

## 2021-07-29 DIAGNOSIS — I10 Essential (primary) hypertension: Secondary | ICD-10-CM

## 2021-07-29 MED ORDER — LOSARTAN POTASSIUM 100 MG PO TABS: 100.0000 mg | ORAL_TABLET | Freq: Every day | ORAL | 1 refills | Status: DC

## 2021-07-29 NOTE — Telephone Encounter (Signed)
Lmtcb to office to inform him medication refill for losartan was sent in to pharmacy ?

## 2021-07-29 NOTE — Telephone Encounter (Signed)
Lmtcb to office to inform him medication refill for losartan was sent in to pharmacy ?

## 2021-08-11 ENCOUNTER — Encounter: Payer: Self-pay | Admitting: Family

## 2021-08-11 ENCOUNTER — Ambulatory Visit (INDEPENDENT_AMBULATORY_CARE_PROVIDER_SITE_OTHER): Payer: BC Managed Care – PPO | Admitting: Family

## 2021-08-11 VITALS — BP 130/78 | HR 76 | Temp 98.0°F | Ht 69.0 in | Wt 203.2 lb

## 2021-08-11 DIAGNOSIS — D649 Anemia, unspecified: Secondary | ICD-10-CM | POA: Diagnosis not present

## 2021-08-11 DIAGNOSIS — H4902 Third [oculomotor] nerve palsy, left eye: Secondary | ICD-10-CM

## 2021-08-11 DIAGNOSIS — E1165 Type 2 diabetes mellitus with hyperglycemia: Secondary | ICD-10-CM | POA: Diagnosis not present

## 2021-08-11 DIAGNOSIS — Z23 Encounter for immunization: Secondary | ICD-10-CM | POA: Diagnosis not present

## 2021-08-11 DIAGNOSIS — I10 Essential (primary) hypertension: Secondary | ICD-10-CM

## 2021-08-11 LAB — CBC WITH DIFFERENTIAL/PLATELET
Basophils Absolute: 0.1 10*3/uL (ref 0.0–0.1)
Basophils Relative: 0.8 % (ref 0.0–3.0)
Eosinophils Absolute: 0.3 10*3/uL (ref 0.0–0.7)
Eosinophils Relative: 3.5 % (ref 0.0–5.0)
HCT: 40.6 % (ref 39.0–52.0)
Hemoglobin: 13.3 g/dL (ref 13.0–17.0)
Lymphocytes Relative: 19.8 % (ref 12.0–46.0)
Lymphs Abs: 1.7 10*3/uL (ref 0.7–4.0)
MCHC: 32.7 g/dL (ref 30.0–36.0)
MCV: 88.9 fl (ref 78.0–100.0)
Monocytes Absolute: 0.8 10*3/uL (ref 0.1–1.0)
Monocytes Relative: 9.1 % (ref 3.0–12.0)
Neutro Abs: 5.6 10*3/uL (ref 1.4–7.7)
Neutrophils Relative %: 66.8 % (ref 43.0–77.0)
Platelets: 237 10*3/uL (ref 150.0–400.0)
RBC: 4.57 Mil/uL (ref 4.22–5.81)
RDW: 13.2 % (ref 11.5–15.5)
WBC: 8.4 10*3/uL (ref 4.0–10.5)

## 2021-08-11 LAB — IBC + FERRITIN
Ferritin: 59.2 ng/mL (ref 22.0–322.0)
Iron: 44 ug/dL (ref 42–165)
Saturation Ratios: 11.1 % — ABNORMAL LOW (ref 20.0–50.0)
TIBC: 396.2 ug/dL (ref 250.0–450.0)
Transferrin: 283 mg/dL (ref 212.0–360.0)

## 2021-08-11 LAB — COMPREHENSIVE METABOLIC PANEL
ALT: 32 U/L (ref 0–53)
AST: 37 U/L (ref 0–37)
Albumin: 4.7 g/dL (ref 3.5–5.2)
Alkaline Phosphatase: 62 U/L (ref 39–117)
BUN: 20 mg/dL (ref 6–23)
CO2: 26 mEq/L (ref 19–32)
Calcium: 9.6 mg/dL (ref 8.4–10.5)
Chloride: 102 mEq/L (ref 96–112)
Creatinine, Ser: 0.98 mg/dL (ref 0.40–1.50)
GFR: 88.71 mL/min (ref >60.00)
Glucose, Bld: 86 mg/dL (ref 70–99)
Potassium: 4.2 mEq/L (ref 3.5–5.1)
Sodium: 135 mEq/L (ref 135–145)
Total Bilirubin: 0.5 mg/dL (ref 0.2–1.2)
Total Protein: 7.5 g/dL (ref 6.0–8.3)

## 2021-08-11 LAB — B12 AND FOLATE PANEL
Folate: >23.5 ng/mL (ref >5.9)
Vitamin B-12: >1504 pg/mL — ABNORMAL HIGH (ref 211–911)

## 2021-08-11 LAB — HEMOGLOBIN A1C: Hgb A1c MFr Bld: 7.7 % — ABNORMAL HIGH (ref 4.6–6.5)

## 2021-08-11 NOTE — Patient Instructions (Addendum)
Please keep appointment with ophthalmology, Dr Earle Gell, as discussed today ? ?Nice to see you! ? ? ? ? ?

## 2021-08-11 NOTE — Assessment & Plan Note (Signed)
Chronic, stable.  Continue losartan 100mg °

## 2021-08-11 NOTE — Progress Notes (Signed)
? ?Subjective:  ? ? Patient ID: [facility], male    DOB: 04-02-1971, 51 y.o.   MRN: [id number] ? ?CC: [facility] is a 51 y.o. male who presents today for follow up.  ? ?HPI: Accompanied by mother ?No new complaints  ?Feels well today ? ? ?DM-compliant with metformin 1000 mg twice daily, Januvia 100 mg. ? ?HTN- compliant with losartan 129m.  ?History of 3rd nerve palsy of the left eye.  Blurry vision has completely resolved.  ? ?He has follow-up with ophthalmology, Dr PGeorgette Dover scheduled in fall 2023.  ? ?HISTORY:  ?Past Medical History:  ?Diagnosis Date  ? Bell's palsy   ? Stroke ([facility]   ? ?No past surgical history on file. ?Family History  ?Problem Relation Age of Onset  ? Diabetes Mother   ? Diabetes Father   ? Thyroid cancer Maternal Grandmother   ? ? ?Allergies: Patient has no known allergies. ?Current Outpatient Medications on File Prior to Visit  ?Medication Sig Dispense Refill  ? aspirin EC 81 MG tablet Take 81 mg by mouth daily.    ? blood glucose meter kit and supplies Dispense based on patient and insurance preference. Use up to four times daily as directed. (FOR ICD-10 E10.9, E11.9). ? ?Check blood sugar up to 4 times per day. 1 each 0  ? Cholecalciferol 50 MCG (2000 UT) TABS Take 1 capsule by mouth daily.    ? JANUVIA 100 MG tablet Take 1 tablet (100 mg total) by mouth daily. 90 tablet 0  ? losartan (COZAAR) 100 MG tablet Take 1 tablet (100 mg total) by mouth daily. 90 tablet 1  ? metFORMIN (GLUCOPHAGE) 1000 MG tablet Take 1 tablet (1,000 mg total) by mouth 2 (two) times daily with a meal. 180 tablet 3  ? rosuvastatin (CRESTOR) 10 MG tablet Take 1 tablet (10 mg total) by mouth daily. 90 tablet 2  ? thiamine 100 MG tablet Take 1 tablet by mouth daily.    ? vitamin B-12 (CYANOCOBALAMIN) 500 MCG tablet Take 1 tablet by mouth daily.    ? ?No current facility-administered medications on file prior to visit.  ? ? ?Social History  ? ?Tobacco Use  ? Smoking status: Never  ? Smokeless  tobacco: Never  ?Vaping Use  ? Vaping Use: Never used  ?Substance Use Topics  ? Alcohol use: Never  ? Drug use: Never  ? ? ?Review of Systems  ?Constitutional:  Negative for chills and fever.  ?Respiratory:  Negative for cough.   ?Cardiovascular:  Negative for chest pain and palpitations.  ?Gastrointestinal:  Negative for nausea and vomiting.  ?   ?Objective:  ?  ?BP 130/78 (BP Location: Left Arm, Patient Position: Sitting, Cuff Size: Normal)   Pulse 76   Temp 98 ?F (36.7 ?C) (Oral)   Ht 5' 9"  (1.753 m)   Wt 203 lb 3.2 oz (92.2 kg)   SpO2 97%   BMI 30.01 kg/m?  ?BP Readings from Last 3 Encounters:  ?08/11/21 130/78  ?06/09/21 130/82  ?05/03/21 (!) 158/80  ? ?Wt Readings from Last 3 Encounters:  ?08/11/21 203 lb 3.2 oz (92.2 kg)  ?06/09/21 199 lb 3.2 oz (90.4 kg)  ?05/03/21 192 lb 4 oz (87.2 kg)  ? ? ?Physical Exam ?Vitals reviewed.  ?Constitutional:   ?   Appearance: He is well-developed.  ?Cardiovascular:  ?   Rate and Rhythm: Regular rhythm.  ?   Heart sounds: Normal heart sounds.  ?Pulmonary:  ?   Effort: Pulmonary effort  is normal. No respiratory distress.  ?   Breath sounds: Normal breath sounds. No wheezing, rhonchi or rales.  ?Skin: ?   General: Skin is warm and dry.  ?Neurological:  ?   Mental Status: He is alert.  ?Psychiatric:     ?   Speech: Speech normal.     ?   Behavior: Behavior normal.  ? ? ?   ?Assessment & Plan:  ? ?Problem List Items Addressed This Visit   ? ?  ? Cardiovascular and Mediastinum  ? HTN (hypertension)  ?  Chronic, stable. Continue losartan 140m ? ?  ?  ?  ? Endocrine  ? Type 2 diabetes mellitus with hyperglycemia, without long-term current use of insulin ([facility]) - Primary  ?  Uncontrolled  ?Lab Results  ?Component Value Date  ? HGBA1C 7.7 (H) 08/11/2021  ? ? ?Continue metformin 1000 mg twice daily,. Will discuss with patient discontinuing Januvia 100 mg and starting ozempic for better diabetic control.  ? ?  ?  ? Relevant Orders  ? Comprehensive metabolic panel (Completed)  ?  Hemoglobin A1c (Completed)  ?  ? Nervous and Auditory  ? Third (oculomotor) nerve palsy, left eye  ?  Blurriness of left eye completely resolved. He declines any further follow up with neuro ophthalmology. He will keep upcoming appointment with Dr PGeorgette Dover ? ?  ?  ? ?Other Visit Diagnoses   ? ? Anemia, unspecified type      ? Relevant Orders  ? CBC with Differential/Platelet (Completed)  ? B12 and Folate Panel (Completed)  ? IBC + Ferritin (Completed)  ? Need for shingles vaccine      ? Relevant Orders  ? Varicella-zoster vaccine IM (Shingrix) (Completed)  ? ?  ? ? ? ?I am having CIgnacia Palmamaintain his blood glucose meter kit and supplies, aspirin EC, metFORMIN, rosuvastatin, Januvia, Cholecalciferol, vitamin B-12, thiamine, and losartan. ? ? ?No orders of the defined types were placed in this encounter. ? ? ?Return precautions given.  ? ?Risks, benefits, and alternatives of the medications and treatment plan prescribed today were discussed, and patient expressed understanding.  ? ?Education regarding symptom management and diagnosis given to patient on AVS. ? ?Continue to follow with ABurnard Hawthorne FNP for routine health maintenance.  ? ?CIgnacia Palmaand I agreed with plan.  ? ?MMable Paris FNP ? ? ?

## 2021-08-11 NOTE — Assessment & Plan Note (Signed)
Uncontrolled  ?Lab Results  ?Component Value Date  ? HGBA1C 7.7 (H) 08/11/2021  ? ? ?Continue metformin 1000 mg twice daily,. Will discuss with patient discontinuing Januvia 100 mg and starting ozempic for better diabetic control.  ? ?

## 2021-08-11 NOTE — Assessment & Plan Note (Signed)
Blurriness of left eye completely resolved. He declines any further follow up with neuro ophthalmology. He will keep upcoming appointment with Dr Georgette Dover. ?

## 2021-08-27 ENCOUNTER — Telehealth: Payer: Self-pay | Admitting: Family

## 2021-08-27 ENCOUNTER — Ambulatory Visit: Payer: BC Managed Care – PPO | Admitting: Family

## 2021-08-27 NOTE — Telephone Encounter (Signed)
Patient needing a return to work note.

## 2021-08-27 NOTE — Telephone Encounter (Signed)
I spoke with patient's step dad with patient in the background. He was needing a work that released him to go back to work. I stated that there was paperwork faxed to [employer], the company used by [employer] [place] that released him as of 07/12/21 to go back to work without restrictions. I have provided a letter but let him & let him know that Claris Che was not here to sign. Step dad stated that [employer] was requiring this or patient could be let go from his job. I will place letter as well as copy of [employer] paperwork upfront for family to pick up.

## 2021-10-20 ENCOUNTER — Ambulatory Visit: Payer: BC Managed Care – PPO | Admitting: Family

## 2021-10-28 ENCOUNTER — Telehealth: Payer: Self-pay | Admitting: Family

## 2021-10-28 ENCOUNTER — Other Ambulatory Visit: Payer: Self-pay | Admitting: Family

## 2021-10-28 DIAGNOSIS — E785 Hyperlipidemia, unspecified: Secondary | ICD-10-CM

## 2021-10-28 DIAGNOSIS — E1165 Type 2 diabetes mellitus with hyperglycemia: Secondary | ICD-10-CM

## 2021-10-28 DIAGNOSIS — I10 Essential (primary) hypertension: Secondary | ICD-10-CM

## 2021-10-28 MED ORDER — METFORMIN HCL 1000 MG PO TABS: 1000.0000 mg | ORAL_TABLET | Freq: Two times a day (BID) | ORAL | 3 refills | Status: DC

## 2021-10-28 MED ORDER — LOSARTAN POTASSIUM 100 MG PO TABS: 100.0000 mg | ORAL_TABLET | Freq: Every day | ORAL | 3 refills | Status: DC

## 2021-10-28 MED ORDER — ROSUVASTATIN CALCIUM 10 MG PO TABS: 10.0000 mg | ORAL_TABLET | Freq: Every day | ORAL | 2 refills | Status: DC

## 2021-10-28 NOTE — Telephone Encounter (Signed)
Patient called office back and said he is also out of his metFORMIN (GLUCOPHAGE) 1000 MG tablet.

## 2021-10-28 NOTE — Telephone Encounter (Signed)
Refill sent for metformin. 

## 2021-10-28 NOTE — Telephone Encounter (Signed)
REFILLS SENT AND PATIENT WAS NOTIFIED

## 2021-10-28 NOTE — Telephone Encounter (Signed)
Patient is requesting the following refills; trosuvastatin (CRESTOR) 10 MG table and losartan (COZAAR) 100 MG tablet. Pharmacy is [facility] on [address].

## 2021-11-05 ENCOUNTER — Other Ambulatory Visit: Payer: Self-pay

## 2021-11-05 DIAGNOSIS — E1165 Type 2 diabetes mellitus with hyperglycemia: Secondary | ICD-10-CM

## 2021-11-05 MED ORDER — METFORMIN HCL 1000 MG PO TABS: 1000.0000 mg | ORAL_TABLET | Freq: Two times a day (BID) | ORAL | 3 refills | Status: DC

## 2021-11-22 ENCOUNTER — Encounter: Payer: Self-pay | Admitting: Family

## 2021-11-22 ENCOUNTER — Ambulatory Visit (INDEPENDENT_AMBULATORY_CARE_PROVIDER_SITE_OTHER): Payer: BC Managed Care – PPO | Admitting: Family

## 2021-11-22 VITALS — BP 118/78 | HR 97 | Temp 97.2°F | Ht 69.0 in | Wt 200.6 lb

## 2021-11-22 DIAGNOSIS — I10 Essential (primary) hypertension: Secondary | ICD-10-CM | POA: Diagnosis not present

## 2021-11-22 DIAGNOSIS — E785 Hyperlipidemia, unspecified: Secondary | ICD-10-CM

## 2021-11-22 DIAGNOSIS — E1165 Type 2 diabetes mellitus with hyperglycemia: Secondary | ICD-10-CM

## 2021-11-22 LAB — POCT GLYCOSYLATED HEMOGLOBIN (HGB A1C): Hemoglobin A1C: 7.3 % — AB (ref 4.0–5.6)

## 2021-11-22 LAB — MICROALBUMIN / CREATININE URINE RATIO
Creatinine,U: 128.4 mg/dL
Microalb Creat Ratio: 4.6 mg/g (ref 0.0–30.0)
Microalb, Ur: 6 mg/dL — ABNORMAL HIGH (ref 0.0–1.9)

## 2021-11-22 NOTE — Assessment & Plan Note (Signed)
Chronic, stable continue losartan 100 mg

## 2021-11-22 NOTE — Progress Notes (Signed)
7.3Has eye appt in [place]

## 2021-11-22 NOTE — Assessment & Plan Note (Addendum)
Lab Results  Component Value Date   HGBA1C 7.3 (A) 11/22/2021   Uncontrolled.  Discussed A1c goal closer to 6.5.  Long discussion with patient as it relates to his diet.  Counseled him on following a low glycemic diet.  Advised I think Ozempic would be a better medication for him to consider versus Januvia.  He declines at this time making medication changes and would like to focus on dietary changes and weight loss.  He politely declines nutrition consult at this time.  Continue metformin 1000 mg twice daily

## 2021-11-22 NOTE — Assessment & Plan Note (Signed)
Lab Results  Component Value Date   LDLCALC 39 02/22/2021  Chronic, stable.  Continue Crestor 10 mg

## 2021-11-22 NOTE — Patient Instructions (Signed)
Consider nutrition referral   Please download Myfitness Pal App ( free). You may log every thing you eat for even 2-3 days to get a better of idea of true daily calories. To loose weight, you have to use more calories than than consumed and essentially create caloric deficit to loose weight. The goal is 1-2 lbs per week of weight loss.   You review below from [facility].   [email or web address].it  Calorie counting made easy  Eat less, exercise more. If only it were that simple! As most dieters know, losing weight can be very challenging. As this report details, a range of influences can affect how people gain and lose weight. But a basic understanding of how to tip your energy balance in favor of weight loss is a good place to start.  Start by determining how many calories you should consume each day. To do so, you need to know how many calories you need to maintain your current weight. Doing this requires a few simple calculations.  First, multiply your current weight by 15 -- that's roughly the number of calories per pound of body weight needed to maintain your current weight if you are moderately active. Moderately active means getting at least 30 minutes of physical activity a day in the form of exercise (walking at a brisk pace, climbing stairs, or active gardening). Let's say you're a woman who is 5 feet, 4 inches tall and weighs 155 pounds, and you need to lose about 15 pounds to put you in a healthy weight range. If you multiply 155 by 15, you will get 2,325, which is the number of calories per day that you need in order to maintain your current weight (weight-maintenance calories). To lose weight, you will need to get below that total.  For example, to lose 1 to 2 pounds a week -- a rate that experts consider safe -- your food consumption should provide 500 to 1,000 calories less than your total weight-maintenance calories. If you need  2,325 calories a day to maintain your current weight, reduce your daily calories to between 1,325 and 1,825. If you are sedentary, you will also need to build more activity into your day. In order to lose at least a pound a week, try to do at least 30 minutes of physical activity on most days, and reduce your daily calorie intake by at least 500 calories. However, calorie intake should not fall below 1,200 a day in women or 1,500 a day in men, except under the supervision of a health professional. Eating too few calories can endanger your health by depriving you of needed nutrients.  Meeting your calorie target How can you meet your daily calorie target? One approach is to add up the number of calories per serving of all the foods that you eat, and then plan your menus accordingly. You can buy books that list calories per serving for many foods. In addition, the nutrition labels on all packaged foods and beverages provide calories per serving information. Make a point of reading the labels of the foods and drinks you use, noting the number of calories and the serving sizes. Many recipes published in cookbooks, newspapers, and magazines provide similar information.  If you hate counting calories, a different approach is to restrict how much and how often you eat, and to eat meals that are low in calories. Dietary guidelines issued by the American Heart Association stress common sense in choosing your foods rather than focusing strictly on numbers,  such as total calories or calories from fat. Whichever method you choose, research shows that a regular eating schedule -- with meals and snacks planned for certain times each day -- makes for the most successful approach. The same applies after you have lost weight and want to keep it off. Sticking with an eating schedule increases your chance of maintaining your new weight.    This is  Dr. Melina Schools  ( an amazing physician in my office!)  example of a  "Low GI"   Diet:  It will allow you to lose 4 to 8  lbs  per month if you follow it carefully.  Your goal with exercise is a minimum of 30 minutes of aerobic exercise 5 days per week (Walking does not count once it becomes easy!)    All of the foods can be found at grocery stores and in bulk at [employer].  The Atkins protein bars and shakes are available in more varieties at Target, WalMart and [employer]'s Foods.     7 AM Breakfast:  Choose from the following:  Low carbohydrate Protein  Shakes (I recommend the  Premier Protein chocolate shakes,  EAS AdvantEdge "Carb Control" shakes  Or the Atkins shakes all are under 3 net carbs)     a scrambled egg/bacon/cheese burrito made with Mission's "carb balance" whole wheat tortilla  (about 10 net carbs )  Medical laboratory scientific officer (basically a quiche without the pastry crust) that is eaten cold and very convenient way to get your eggs.  8 carbs)  If you make your own protein shakes, avoid bananas and pineapple,  And use low carb greek yogurt or original /unsweetened almond or soy milk    Avoid cereal and bananas, oatmeal and cream of wheat and grits. They are loaded with carbohydrates!   10 AM: high protein snack:  Protein bar by Atkins (the snack size, under 200 cal, usually < 6 net carbs).    A stick of cheese:  Around 1 carb,  100 cal     Dannon Light n Fit [place] Yogurt  (80 cal, 8 carbs)  Other so called "protein bars" and Greek yogurts tend to be loaded with carbohydrates.  Remember, in food advertising, the word "energy" is synonymous for " carbohydrate."  Lunch:   A Sandwich using the bread choices listed, Can use any  Eggs,  lunchmeat, grilled meat or canned tuna), avocado, regular mayo/mustard  and cheese.  A Salad using blue cheese, ranch,  Goddess or vinagrette,  Avoid taco shells, croutons or "confetti" and no "candied nuts" but regular nuts OK.   No pretzels, nabs  or chips.  Pickles and miniature sweet peppers are a good low carb  alternative that provide a "crunch"  The bread is the only source of carbohydrate in a sandwich and  can be decreased by trying some of the attached alternatives to traditional loaf bread   Avoid "Low fat dressings, as well as Reyne Dumas and [employer] dressings They are loaded with sugar!   3 PM/ Mid day  Snack:  Consider  1 ounce of  almonds, walnuts, pistachios, pecans, peanuts,  Macadamia nuts or a nut medley.  Avoid "granola and granola bars "  Mixed nuts are ok in moderation as long as there are no raisins,  cranberries or dried fruit.   KIND bars are OK if you get the low glycemic index variety   Try the prosciutto/mozzarella cheese sticks by Fiorruci  In deli /backery section  High protein      6 PM  Dinner:     Meat/fowl/fish with a green salad, and either broccoli, cauliflower, green beans, spinach, brussel sprouts or  Lima beans. DO NOT BREAD THE PROTEIN!!      There is a low carb pasta by Dreamfield's that is acceptable and tastes great: only 5 digestible carbs/serving.( All grocery stores but BJs carry it ) Several ready made meals are available low carb:   Try Michel Angelo's chicken piccata or chicken or eggplant parm over low carb pasta.(Lowes and BJs)   Clifton Custard Sanchez's "Carnitas" (pulled pork, no sauce,  0 carbs) or his beef pot roast to make a dinner burrito (at BJ's)  Pesto over low carb pasta (bj's sells a good quality pesto in the center refrigerated section of the deli   Try satueeing  Roosvelt Harps with mushroooms as a good side   [employer] Giant makes a mashed cauliflower that tastes like mashed potatoes  Whole wheat pasta is still full of digestible carbs and  Not as low in glycemic index as Dreamfield's.   Brown rice is still rice,  So skip the rice and noodles if you eat [place] or [place] (or at least limit to 1/2 cup)  9 PM snack :   Breyer's "low carb" fudgsicle or  ice cream bar (Carb Smart line), or  Weight Watcher's ice cream bar , or another "no sugar added" ice  cream;  a serving of fresh berries/cherries with whipped cream   Cheese or DANNON'S LlGHT N FIT GREEK YOGURT  8 ounces of [place] unsweetened almond/cococunut milk    Treat yourself to a parfait made with whipped cream blueberiies, walnuts and vanilla greek yogurt  Avoid bananas, pineapple, grapes  and watermelon on a regular basis because they are high in sugar.  THINK OF THEM AS DESSERT  Remember that snack Substitutions should be less than 10 NET carbs per serving and meals < 20 carbs. Remember to subtract fiber grams to get the "net carbs."

## 2021-11-22 NOTE — Progress Notes (Signed)
Subjective:    Patient ID: Joseph Davis, male    DOB: 1971/04/10, 51 y.o.   MRN: [id number]  CC: Joseph Davis is a 51 y.o. male who presents today for follow up.   HPI: Accompanied by mother Feels well today.  No new concerns   Diabetes-he remains on Januvia.  He never started ozempic.  Compliant with metformin 1000 mg twice daily. For breakfast  he eats frosted flakes, sandwich and chips for lunch, and dinner varies however may be a 'TV dinner'.  Snacks are grapes, peanut butter crackers. No  soda, tea. 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  No recurrence of left eye blurry vision.  Follow-up with Dr. Drinda Davis  Hypertension-compliant losartan 100 mg  Hyperlipidemia-compliant with Crestor 10 mg   HISTORY:  Past Medical History:  Diagnosis Date   Bell's palsy    Stroke [facility])    No past surgical history on file. Family History  Problem Relation Age of Onset   Diabetes Mother    Diabetes Father    Thyroid cancer Maternal Grandmother     Allergies: Patient has no known allergies. Current Outpatient Medications on File Prior to Visit  Medication Sig Dispense Refill   aspirin EC 81 MG tablet Take 81 mg by mouth daily.     blood glucose meter kit and supplies Dispense based on patient and insurance preference. Use up to four times daily as directed. (FOR ICD-10 E10.9, E11.9).  Check blood sugar up to 4 times per day. 1 each 0   Cholecalciferol 50 MCG (2000 UT) TABS Take 1 capsule by mouth daily.     JANUVIA 100 MG tablet Take 1 tablet by mouth once daily 90 tablet 3   losartan (COZAAR) 100 MG tablet Take 1 tablet (100 mg total) by mouth daily. 90 tablet 3   metFORMIN (GLUCOPHAGE) 1000 MG tablet Take 1 tablet (1,000 mg total) by mouth 2 (two) times daily with a meal. 180 tablet 3   rosuvastatin (CRESTOR) 10 MG tablet Take 1 tablet (10 mg total) by mouth daily. 90 tablet 2   thiamine 100 MG tablet Take 1 tablet by mouth daily.     vitamin B-12 (CYANOCOBALAMIN)  500 MCG tablet Take 1 tablet by mouth daily.     No current facility-administered medications on file prior to visit.    Social History   Tobacco Use   Smoking status: Never   Smokeless tobacco: Never  Vaping Use   Vaping Use: Never used  Substance Use Topics   Alcohol use: Never   Drug use: Never    Review of Systems  Constitutional:  Negative for chills and fever.  Eyes:  Negative for visual disturbance.  Respiratory:  Negative for cough.   Cardiovascular:  Negative for chest pain and palpitations.  Gastrointestinal:  Negative for nausea and vomiting.      Objective:    BP 118/78 (BP Location: Left Arm, Patient Position: Sitting, Cuff Size: Normal)   Pulse 97   Temp (!) 97.2 F (36.2 C) (Oral)   Ht _0  (1.753 m)   Wt 200 lb 9.6 oz (91 kg)   SpO2 96%   BMI 29.62 kg/m  BP Readings from Last 3 Encounters:  11/22/21 118/78  08/11/21 130/78  06/09/21 130/82   Wt Readings from Last 3 Encounters:  11/22/21 200 lb 9.6 oz (91 kg)  08/11/21 203 lb 3.2 oz (92.2 kg)  06/09/21 199 lb 3.2 oz (90.4 kg)    Physical Exam Vitals  reviewed.  Constitutional:      Appearance: He is well-developed.  Cardiovascular:     Rate and Rhythm: Regular rhythm.     Heart sounds: Normal heart sounds.  Pulmonary:     Effort: Pulmonary effort is normal. No respiratory distress.     Breath sounds: Normal breath sounds. No wheezing, rhonchi or rales.  Skin:    General: Skin is warm and dry.  Neurological:     Mental Status: He is alert.  Psychiatric:        Speech: Speech normal.        Behavior: Behavior normal.        Assessment & Plan:   Problem List Items Addressed This Visit       Cardiovascular and Mediastinum   HTN (hypertension)    Chronic, stable continue losartan 100 mg        Endocrine   Type 2 diabetes mellitus with hyperglycemia, without long-term current use of insulin ([place]) - Primary    Lab Results  Component Value Date   HGBA1C 7.3 (A) 11/22/2021   Uncontrolled.  Discussed A1c goal closer to 6.5.  Long discussion with patient as it relates to his diet.  Counseled him on following a low glycemic diet.  Advised I think Ozempic would be a better medication for him to consider versus Januvia.  He declines at this time making medication changes and would like to focus on dietary changes and weight loss.  He politely declines nutrition consult at this time.  Continue metformin 1000 mg twice daily      Relevant Orders   Microalbumin / creatinine urine ratio   POCT HgB A1C (Completed)     Other   HLD (hyperlipidemia)    Lab Results  Component Value Date   LDLCALC 39 02/22/2021  Chronic, stable.  Continue Crestor 10 mg        I am having Joseph Davis maintain his blood glucose meter kit and supplies, aspirin EC, Cholecalciferol, cyanocobalamin, thiamine, rosuvastatin, losartan, Januvia, and metFORMIN.   No orders of the defined types were placed in this encounter.   Return precautions given.   Risks, benefits, and alternatives of the medications and treatment plan prescribed today were discussed, and patient expressed understanding.   Education regarding symptom management and diagnosis given to patient on AVS.  Continue to follow with Joseph Hawthorne, FNP for routine health maintenance.   Joseph Davis and I agreed with plan.   Joseph Paris, FNP

## 2021-11-23 ENCOUNTER — Telehealth: Payer: Self-pay

## 2021-11-23 NOTE — Telephone Encounter (Signed)
LVM to call back to office to go over results 

## 2021-11-25 ENCOUNTER — Other Ambulatory Visit: Payer: Self-pay

## 2021-11-25 DIAGNOSIS — R809 Proteinuria, unspecified: Secondary | ICD-10-CM

## 2021-11-25 NOTE — Progress Notes (Signed)
Amb referral 

## 2021-11-26 ENCOUNTER — Other Ambulatory Visit: Payer: Self-pay

## 2021-11-26 DIAGNOSIS — R809 Proteinuria, unspecified: Secondary | ICD-10-CM

## 2021-11-26 NOTE — Progress Notes (Signed)
Amb nephrology

## 2021-12-29 LAB — HM DIABETES EYE EXAM

## 2022-01-07 NOTE — Progress Notes (Signed)
abstract

## 2022-02-17 ENCOUNTER — Other Ambulatory Visit: Payer: Self-pay | Admitting: Nephrology

## 2022-02-17 DIAGNOSIS — R8290 Unspecified abnormal findings in urine: Secondary | ICD-10-CM

## 2022-02-17 DIAGNOSIS — R809 Proteinuria, unspecified: Secondary | ICD-10-CM

## 2022-02-22 ENCOUNTER — Ambulatory Visit (INDEPENDENT_AMBULATORY_CARE_PROVIDER_SITE_OTHER): Payer: BC Managed Care – PPO | Admitting: Family

## 2022-02-22 ENCOUNTER — Encounter: Payer: Self-pay | Admitting: Family

## 2022-02-22 VITALS — BP 136/80 | HR 70 | Temp 98.6°F | Ht 69.0 in | Wt 207.8 lb

## 2022-02-22 DIAGNOSIS — Z23 Encounter for immunization: Secondary | ICD-10-CM | POA: Diagnosis not present

## 2022-02-22 DIAGNOSIS — I10 Essential (primary) hypertension: Secondary | ICD-10-CM

## 2022-02-22 DIAGNOSIS — E1165 Type 2 diabetes mellitus with hyperglycemia: Secondary | ICD-10-CM

## 2022-02-22 LAB — POCT GLYCOSYLATED HEMOGLOBIN (HGB A1C): Hemoglobin A1C: 6.6 % — AB (ref 4.0–5.6)

## 2022-02-22 NOTE — Assessment & Plan Note (Addendum)
Lab Results  Component Value Date   HGBA1C 6.6 (A) 02/22/2022  Excellent control.  Continue metformin 1000 mg twice daily, januvia 100mg .

## 2022-02-22 NOTE — Progress Notes (Signed)
Subjective:    Patient ID: Joseph Davis, male    DOB: 03/14/1971, 51 y.o.   MRN: [id number]  CC: Joseph Davis is a 51 y.o. male who presents today for follow up.   HPI: Feels well today.  Accompanied by mother.  No complaints.  He is eating healthier diet, less portion sizes.   HTN- compliant with losartan 175m.  Denies chest pain  DM-compliant with metformin 1000 mg twice daily, januvia 1045m  Consult with Dr SiCandiss Norse0/26/23  HISTORY:  Past Medical History:  Diagnosis Date   Bell's palsy    [place] [place]   No past surgical history on file. Family History  Problem Relation Age of Onset   Diabetes Mother    Diabetes Father    Thyroid cancer Maternal Grandmother     Allergies: Patient has no known allergies. Current Outpatient Medications on File Prior to Visit  Medication Sig Dispense Refill   aspirin EC 81 MG tablet Take 81 mg by mouth daily.     blood glucose meter kit and supplies Dispense based on patient and insurance preference. Use up to four times daily as directed. (FOR ICD-10 E10.9, E11.9).  Check blood sugar up to 4 times per day. 1 each 0   Cholecalciferol 50 MCG (2000 UT) TABS Take 1 capsule by mouth daily.     JANUVIA 100 MG tablet Take 1 tablet by mouth once daily 90 tablet 3   losartan (COZAAR) 100 MG tablet Take 1 tablet (100 mg total) by mouth daily. 90 tablet 3   metFORMIN (GLUCOPHAGE) 1000 MG tablet Take 1 tablet (1,000 mg total) by mouth 2 (two) times daily with a meal. 180 tablet 3   rosuvastatin (CRESTOR) 10 MG tablet Take 1 tablet (10 mg total) by mouth daily. 90 tablet 2   thiamine 100 MG tablet Take 1 tablet by mouth daily.     vitamin B-12 (CYANOCOBALAMIN) 500 MCG tablet Take 1 tablet by mouth daily.     No current facility-administered medications on file prior to visit.    Social History   Tobacco Use   Smoking status: Never   Smokeless tobacco: Never  Vaping Use   Vaping Use: Never used  Substance Use Topics    Alcohol use: Never   Drug use: Never    Review of Systems  Constitutional:  Negative for chills and fever.  Respiratory:  Negative for cough.   Cardiovascular:  Negative for chest pain and palpitations.  Gastrointestinal:  Negative for nausea and vomiting.      Objective:    BP 136/80 (BP Location: Left Arm, Patient Position: Sitting, Cuff Size: Normal)   Pulse 70   Temp 98.6 F (37 C) (Oral)   Ht _0  (1.753 m)   Wt 207 lb 12.8 oz (94.3 kg)   SpO2 95%   BMI 30.69 kg/m  BP Readings from Last 3 Encounters:  02/22/22 136/80  11/22/21 118/78  08/11/21 130/78   Wt Readings from Last 3 Encounters:  02/22/22 207 lb 12.8 oz (94.3 kg)  11/22/21 200 lb 9.6 oz (91 kg)  08/11/21 203 lb 3.2 oz (92.2 kg)    Physical Exam Vitals reviewed.  Constitutional:      Appearance: He is well-developed.  Cardiovascular:     Rate and Rhythm: Regular rhythm.     Heart sounds: Normal heart sounds.  Pulmonary:     Effort: Pulmonary effort is normal. No respiratory distress.     Breath sounds: Normal breath  sounds. No wheezing, rhonchi or rales.  Skin:    General: Skin is warm and dry.  Neurological:     Mental Status: He is alert.  Psychiatric:        Speech: Speech normal.        Behavior: Behavior normal.        Assessment & Plan:   Problem List Items Addressed This Visit       Cardiovascular and Mediastinum   HTN (hypertension)    Chronic, stable.  Continue metformin 1000 mg twice daily, januvia 163m        Endocrine   Type 2 diabetes mellitus with hyperglycemia, without long-term current use of insulin ([facility]) - Primary    Lab Results  Component Value Date   HGBA1C 6.6 (A) 02/22/2022  Excellent control.  Continue metformin 1000 mg twice daily, januvia 1070m       Relevant Orders   POCT HgB A1C (Completed)     I am having ChIgnacia Palmaaintain his blood glucose meter kit and supplies, aspirin EC, Cholecalciferol, cyanocobalamin, thiamine,  rosuvastatin, losartan, Januvia, and metFORMIN.   No orders of the defined types were placed in this encounter.   Return precautions given.   Risks, benefits, and alternatives of the medications and treatment plan prescribed today were discussed, and patient expressed understanding.   Education regarding symptom management and diagnosis given to patient on AVS.  Continue to follow with ArBurnard HawthorneFNP for routine health maintenance.   ChIgnacia Palmand I agreed with plan.   [name]

## 2022-02-22 NOTE — Assessment & Plan Note (Signed)
Chronic, stable.  Continue metformin 1000 mg twice daily, januvia 100mg 

## 2022-02-22 NOTE — Patient Instructions (Addendum)
Call out patient imaging at [facility] to confirm what date your ultrasound is.   [phone]  Nice to see you!

## 2022-03-01 ENCOUNTER — Ambulatory Visit
Admission: RE | Admit: 2022-03-01 | Discharge: 2022-03-01 | Disposition: A | Discharge status: Home / Self Care | Payer: BC Managed Care – PPO | Source: Ambulatory Visit | Attending: Nephrology | Admitting: Nephrology

## 2022-03-01 DIAGNOSIS — R809 Proteinuria, unspecified: Secondary | ICD-10-CM | POA: Diagnosis present

## 2022-03-01 DIAGNOSIS — R8290 Unspecified abnormal findings in urine: Secondary | ICD-10-CM | POA: Diagnosis present

## 2022-03-25 ENCOUNTER — Telehealth: Payer: Self-pay | Admitting: Family

## 2022-03-25 NOTE — Telephone Encounter (Signed)
All three medications have refills remaining on them.   They were all refilled in July for 90 day supply with 3 refills.  Pt should have 7 months of refills left.   I have left patient a voicemail to contact pharmacy for refills.

## 2022-03-25 NOTE — Telephone Encounter (Signed)
Prescription Request  03/25/2022  Is this a "Controlled Substance" medicine? No  LOV: 02/22/2022  What is the name of the medication or equipment? JANUVIA 100 MG tablet, losartan (COZAAR) 100 MG tablet and  metFORMIN (GLUCOPHAGE) 1000 MG tablet  Have you contacted your pharmacy to request a refill? No   Which pharmacy would you like this sent to?  [facility] [address], [place] - [address] [place] [address] Phone: [phone] Fax: [phone]    Patient notified that their request is being sent to the clinical staff for review and that they should receive a response within 2 business days.   Please advise at [facility] [phone]

## 2022-05-25 ENCOUNTER — Telehealth: Payer: Self-pay | Admitting: Family

## 2022-05-25 ENCOUNTER — Ambulatory Visit (INDEPENDENT_AMBULATORY_CARE_PROVIDER_SITE_OTHER): Payer: BC Managed Care – PPO | Admitting: Family

## 2022-05-25 ENCOUNTER — Encounter: Payer: Self-pay | Admitting: Family

## 2022-05-25 VITALS — BP 128/82 | Temp 98.4°F | Ht 69.0 in | Wt 209.0 lb

## 2022-05-25 DIAGNOSIS — J029 Acute pharyngitis, unspecified: Secondary | ICD-10-CM

## 2022-05-25 DIAGNOSIS — E1165 Type 2 diabetes mellitus with hyperglycemia: Secondary | ICD-10-CM

## 2022-05-25 DIAGNOSIS — I10 Essential (primary) hypertension: Secondary | ICD-10-CM | POA: Diagnosis not present

## 2022-05-25 DIAGNOSIS — R7309 Other abnormal glucose: Secondary | ICD-10-CM

## 2022-05-25 LAB — COMPREHENSIVE METABOLIC PANEL
ALT: 28 U/L (ref 0–53)
AST: 32 U/L (ref 0–37)
Albumin: 4.6 g/dL (ref 3.5–5.2)
Alkaline Phosphatase: 59 U/L (ref 39–117)
BUN: 21 mg/dL (ref 6–23)
CO2: 27 mEq/L (ref 19–32)
Calcium: 10 mg/dL (ref 8.4–10.5)
Chloride: 100 mEq/L (ref 96–112)
Creatinine, Ser: 1.14 mg/dL (ref 0.40–1.50)
GFR: 73.58 mL/min (ref >60.00)
Glucose, Bld: 79 mg/dL (ref 70–99)
Potassium: 4.2 mEq/L (ref 3.5–5.1)
Sodium: 135 mEq/L (ref 135–145)
Total Bilirubin: 0.7 mg/dL (ref 0.2–1.2)
Total Protein: 7.7 g/dL (ref 6.0–8.3)

## 2022-05-25 LAB — LIPID PANEL
Cholesterol: 91 mg/dL (ref 0–200)
HDL: 32.5 mg/dL — ABNORMAL LOW (ref >39.00)
LDL Cholesterol: 47 mg/dL (ref 0–99)
NonHDL: 58.05
Total CHOL/HDL Ratio: 3
Triglycerides: 56 mg/dL (ref 0.0–149.0)
VLDL: 11.2 mg/dL (ref 0.0–40.0)

## 2022-05-25 LAB — POCT GLYCOSYLATED HEMOGLOBIN (HGB A1C): Hemoglobin A1C: 7.5 % — AB (ref 4.0–5.6)

## 2022-05-25 LAB — MICROALBUMIN / CREATININE URINE RATIO
Creatinine,U: 122.2 mg/dL
Microalb Creat Ratio: 0.9 mg/g (ref 0.0–30.0)
Microalb, Ur: 1.1 mg/dL (ref 0.0–1.9)

## 2022-05-25 MED ORDER — RYBELSUS 7 MG PO TABS: 7.0000 mg | ORAL_TABLET | Freq: Every day | ORAL | 2 refills | Status: DC

## 2022-05-25 NOTE — Telephone Encounter (Signed)
LVM to call back to office  

## 2022-05-25 NOTE — Assessment & Plan Note (Signed)
Lab Results  Component Value Date   HGBA1C 7.5 (A) 05/25/2022   Uncontrolled. Stop [place] due to cost, effectiveness. Start ryblesus 73m. Provided sample.  Counseled on blackbox warning as it relates to medullary thyroid cancer, multiple endocrine neoplasia.  Counseled on side effects and how to take Rybelsus.

## 2022-05-25 NOTE — Telephone Encounter (Signed)
Please call pt and clarify h/o thyroid cancer  In chart there was entry that his maternal grandmother had thyroid cancer.   This needs to clarified.   If she had, we need to know what type as it is specifically medullary thyroid cancer as this would be a contraindication to starting rybelsus.

## 2022-05-25 NOTE — Progress Notes (Signed)
Assessment & Plan:  Type 2 diabetes mellitus with hyperglycemia, without long-term current use of insulin [facility]) Assessment & Plan: Lab Results  Component Value Date   HGBA1C 7.5 (A) 05/25/2022   Uncontrolled. Stop [place] due to cost, effectiveness. Start ryblesus 38m. Provided sample.  Counseled on blackbox warning as it relates to medullary thyroid cancer, multiple endocrine neoplasia.  Counseled on side effects and how to take Rybelsus.   Orders: -     Lipid panel -     Microalbumin / creatinine urine ratio -     Comprehensive metabolic panel -     Rybelsus; Take 1 tablet (7 mg total) by mouth daily.  Dispense: 30 tablet; Refill: 2  Elevated glucose -     POCT glycosylated hemoglobin (Hb A1C)  Sore throat  Hypertension, unspecified type Assessment & Plan: Chronic, stable.  Continue  losartan 1063mfor renal protection.       Return precautions given.   Risks, benefits, and alternatives of the medications and treatment plan prescribed today were discussed, and patient expressed understanding.   Education regarding symptom management and diagnosis given to patient on AVS either electronically or printed.  Return in about 3 months (around 08/23/2022).  [name]  Subjective:    Patient ID: Joseph FORRISTERmale    DOB: [birth year]o.   MRN: [id number]: ChKOSUKE CALLs a [age].o. male who presents today for follow up.   HPI: Accompanied by mother today  Feels well today  He has been compliant with metformin and jaTonga     No personal or family h/o thyroid cancer or MEN  Allergies: Patient has no known allergies. Current Outpatient Medications on File Prior to Visit  Medication Sig Dispense Refill   aspirin EC 81 MG tablet Take 81 mg by mouth daily.     blood glucose meter kit and supplies Dispense based on patient and insurance preference. Use up to four times daily as directed. (FOR ICD-10 E10.9, E11.9).  Check blood  sugar up to 4 times per day. 1 each 0   Cholecalciferol 50 MCG (2000 UT) TABS Take 1 capsule by mouth daily.     losartan (COZAAR) 100 MG tablet Take 1 tablet (100 mg total) by mouth daily. 90 tablet 3   metFORMIN (GLUCOPHAGE) 1000 MG tablet Take 1 tablet (1,000 mg total) by mouth 2 (two) times daily with a meal. 180 tablet 3   rosuvastatin (CRESTOR) 10 MG tablet Take 1 tablet (10 mg total) by mouth daily. 90 tablet 2   thiamine 100 MG tablet Take 1 tablet by mouth daily.     vitamin B-12 (CYANOCOBALAMIN) 500 MCG tablet Take 1 tablet by mouth daily.     No current facility-administered medications on file prior to visit.    Review of Systems  Constitutional:  Negative for chills and fever.  Respiratory:  Negative for cough.   Cardiovascular:  Negative for chest pain and palpitations.  Gastrointestinal:  Negative for nausea and vomiting.      Objective:    BP 128/82   Temp 98.4 F (36.9 C) (Oral)   Ht 5' 9"$  (1.753 m)   Wt 209 lb (94.8 kg)   SpO2 97%   BMI 30.86 kg/m  BP Readings from Last 3 Encounters:  05/25/22 128/82  02/22/22 136/80  11/22/21 118/78   Wt Readings from Last 3 Encounters:  05/25/22 209 lb (94.8 kg)  02/22/22 207 lb 12.8 oz (94.3 kg)  11/22/21  200 lb 9.6 oz (91 kg)    Physical Exam Vitals reviewed.  Constitutional:      Appearance: He is well-developed.  Neck:     Thyroid: No thyroid mass, thyromegaly or thyroid tenderness.  Cardiovascular:     Rate and Rhythm: Regular rhythm.     Heart sounds: Normal heart sounds.  Pulmonary:     Effort: Pulmonary effort is normal. No respiratory distress.     Breath sounds: Normal breath sounds. No wheezing, rhonchi or rales.  Skin:    General: Skin is warm and dry.  Neurological:     Mental Status: He is alert.  Psychiatric:        Speech: Speech normal.        Behavior: Behavior normal.

## 2022-05-25 NOTE — Assessment & Plan Note (Signed)
Chronic, stable.  Continue  losartan 153m for renal protection.

## 2022-05-25 NOTE — Patient Instructions (Addendum)
You may finish the [place] as you have 30 day supply THEN stop medication completely .  Then you may  Start rybelsus 31m for 30 days and then we will increase to 765monce daily. I have sent the rybelus 57m28mo your pharmacy.   Also, with the Rybelsus, be very sure that you take on an empty stomach with a little bit of water and take 30 minutes before eating. Do not take with other medicines and no other food with medication. This is important for effectiveness.   Remember black box warning that you may not take this medication if you or a family member is diagnosed with thyroid cancer.    Semaglutide Oral Tablets What is this medicine? SEMAGLUTIDE (Sem a GLOO tide) controls blood sugar in people with type 2 diabetes. It is used with lifestyle changes like diet and exercise. This medicine may be used for other purposes; ask your health care provider or pharmacist if you have questions. COMMON BRAND NAME(S): Rybelsus What should I tell my health care provider before I take this medicine? They need to know if you have any of these conditions: endocrine tumors (MEN 2) or if someone in your family had these tumors eye disease history of pancreatitis kidney disease stomach or intestine problems thyroid cancer or if someone in your family had thyroid cancer vision problems an unusual or allergic reaction to semaglutide, other medicines, foods, dyes, or preservatives pregnant or trying to get pregnant breast-feeding How should I use this medicine? Take this medicine by mouth. Take it as directed on the prescription label at the same time every day. Take the dose right after waking up. Do not eat or drink anything before taking it. Do not take it with any other drink except a glass of plain water that is less than 4 ounces (less than 120 mL). Do not cut, crush or chew this medicine. Swallow the tablets whole. After taking it, do not eat breakfast, drink, or take any other medicines or vitamins for at  least 30 minutes. Keep taking it unless your health care provider tells you to stop. A special MedGuide will be given to you by the pharmacist with each prescription and refill. Be sure to read this information carefully each time. Talk to your health care provider about the use of this medicine in children. Special care may be needed. Overdosage: If you think you have taken too much of this medicine contact a poison control center or emergency room at once. NOTE: This medicine is only for you. Do not share this medicine with others. What if I miss a dose? If you miss a dose, skip it. Take your next dose at the normal time. Do not take extra or 2 doses at the same time to make up for the missed dose. What may interact with this medicine? What may interact with this medicine? aminophylline carbamazepine cyclosporine digoxin levothyroxine other medicines for diabetes phenytoin tacrolimus theophylline warfarin Many medications may cause changes in blood sugar, these include: alcohol containing beverages antiviral medicines for HIV or AIDS aspirin and aspirin-like drugs certain medicines for blood pressure, heart disease, irregular heart beat chromium diuretics male hormones, such as estrogens or progestins, birth control pills fenofibrate gemfibrozil isoniazid lanreotide male hormones or anabolic steroids MAOIs like Carbex, Eldepryl, Marplan, Nardil, and Parnate medicines for weight loss medicines for allergies, asthma, cold, or cough medicines for depression, anxiety, or psychotic disturbances niacin nicotine NSAIDs, medicines for pain and inflammation, like ibuprofen or naproxen octreotide pasireotide  pentamidine phenytoin probenecid quinolone antibiotics such as ciprofloxacin, levofloxacin, ofloxacin some herbal dietary supplements steroid medicines such as prednisone or cortisone sulfamethoxazole; trimethoprim thyroid hormones Some medications can hide the warning  symptoms of low blood sugar (hypoglycemia). You may need to monitor your blood sugar more closely if you are taking one of these medications. These include: beta-blockers, often used for high blood pressure or heart problems (examples include atenolol, metoprolol, propranolol) clonidine guanethidine reserpine This list may not describe all possible interactions. Give your health care provider a list of all the medicines, herbs, non-prescription drugs, or dietary supplements you use. Also tell them if you smoke, drink alcohol, or use illegal drugs. Some items may interact with your medicine. What should I watch for while using this medicine? Visit your health care provider for regular checks on your progress. Check with your health care provider if you have severe diarrhea, nausea, and vomiting, or if you sweat a lot. The loss of too much body fluid may make it dangerous for you to take this medicine. A test called the HbA1C (A1C) will be monitored. This is a simple blood test. It measures your blood sugar control over the last 2 to 3 months. You will receive this test every 3 to 6 months. Learn how to check your blood sugar. Learn the symptoms of low and high blood sugar and how to manage them. Always carry a quick-source of sugar with you in case you have symptoms of low blood sugar. Examples include hard sugar candy or glucose tablets. Make sure others know that you can choke if you eat or drink when you develop serious symptoms of low blood sugar, such as seizures or unconsciousness. Get medical help at once. Tell your health care provider if you have high blood sugar. You might need to change the dose of your medicine. If you are sick or exercising more than usual, you might need to change the dose of your medicine. Do not skip meals. Ask your health care provider if you should avoid alcohol. Many nonprescription cough and cold products contain sugar or alcohol. These can affect blood sugar. Wear a  medical ID bracelet or chain. Carry a card that describes your condition. List the medicines and doses you take on the card. Do not become pregnant while taking this medicine. Women should inform their health care provider if they wish to become pregnant or think they might be pregnant. There is a potential for serious side effects to an unborn child. Talk to your health care provider for more information. Do not breast-feed an infant while taking this medicine. What side effects may I notice from receiving this medicine? Side effects that you should report to your doctor or health care provider as soon as possible: allergic reactions (skin rash, itching or hives; swelling of the face, lips, or tongue) changes in vision diarrhea that continues or is severe infection (fever, chills, cough, sore throat, pain or trouble passing urine) kidney injury (trouble passing urine or change in the amount of urine) low blood sugar (feeling anxious; confusion; dizziness; increased hunger; unusually weak or tired; increased sweating; shakiness; cold, clammy skin; irritable; headache; blurred vision; fast heartbeat; loss of consciousness) lump or swelling on the neck painful or difficulty swallowing severe nausea severe or unusual stomach pain trouble breathing vomiting Side effects that usually do not require medical attention (report these to your doctor or health care provider if they continue or are bothersome): constipation diarrhea nausea upset stomach This list may not  describe all possible side effects. Call your doctor for medical advice about side effects. You may report side effects to FDA at 1-800-FDA-1088. Where should I keep my medicine? Keep out of the reach of children and pets. Store at room temperature between 20 and 25 degrees C (68 and 77 degrees F). Keep this medicine in the original container. Protect from moisture. Keep the container tightly closed. Get rid of any unused medicine after  the expiration date. To get rid of medicines that are no longer needed or have expired: Take the medicine to a medicine take-back program. Check with your pharmacy or law enforcement to find a location. If you cannot return the medicine, check the label or package insert to see if the medicine should be thrown out in the garbage or flushed down the toilet. If you are not sure, ask your health care provider. If it is safe to put it in the trash, take the medicine out of the container. Mix the medicine with cat litter, dirt, coffee grounds, or other unwanted substance. Seal the mixture in a bag or container. Put it in the trash. NOTE: This sheet is a summary. It may not cover all possible information. If you have questions about this medicine, talk to your doctor, pharmacist, or health care provider.  2021 Elsevier/Gold Standard (2020-02-24 15:08:33)

## 2022-05-27 NOTE — Telephone Encounter (Signed)
Spoke to pt and he was not sure of who had thyroid cancer spoke to pt Mom and she was uncertain as well.

## 2022-05-27 NOTE — Telephone Encounter (Signed)
Pt returning call for labs 

## 2022-05-27 NOTE — Telephone Encounter (Signed)
Spoke to pt and went over results with him. Pt verbalized understanding

## 2022-05-31 NOTE — Telephone Encounter (Signed)
LVM for pt Mom Joseph Davis to give [place] a call back to inquire about family history of Thyroid cancer because pt was not sure when I spoke to him.

## 2022-05-31 NOTE — Telephone Encounter (Signed)
Lm for pt to cb  

## 2022-05-31 NOTE — Telephone Encounter (Signed)
Please clarify here  Did pt confirm that he has family member with history of thyroid cancer OR multiple endocrine cancers? Please be specific  We need to have this in chart and correct  If he has family member with h/o thyroid cancer or endocrine cancer, it is not safe for him to take the Rybelsus.

## 2022-05-31 NOTE — Telephone Encounter (Signed)
Spoke to pt Mom and she stated that to her knowledge no one had or has any type of thyroid cancer or endocrine cancers.

## 2022-06-01 NOTE — Telephone Encounter (Signed)
noted 

## 2022-07-21 ENCOUNTER — Other Ambulatory Visit: Payer: Self-pay | Admitting: Family

## 2022-07-21 DIAGNOSIS — E785 Hyperlipidemia, unspecified: Secondary | ICD-10-CM

## 2022-08-24 ENCOUNTER — Ambulatory Visit (INDEPENDENT_AMBULATORY_CARE_PROVIDER_SITE_OTHER): Payer: BC Managed Care – PPO | Admitting: Family

## 2022-08-24 ENCOUNTER — Encounter: Payer: Self-pay | Admitting: Family

## 2022-08-24 VITALS — BP 130/82 | HR 95 | Temp 98.2°F | Ht 69.0 in | Wt 208.2 lb

## 2022-08-24 DIAGNOSIS — E1165 Type 2 diabetes mellitus with hyperglycemia: Secondary | ICD-10-CM

## 2022-08-24 DIAGNOSIS — R7309 Other abnormal glucose: Secondary | ICD-10-CM

## 2022-08-24 DIAGNOSIS — Z7984 Long term (current) use of oral hypoglycemic drugs: Secondary | ICD-10-CM

## 2022-08-24 DIAGNOSIS — I10 Essential (primary) hypertension: Secondary | ICD-10-CM | POA: Diagnosis not present

## 2022-08-24 LAB — POCT GLYCOSYLATED HEMOGLOBIN (HGB A1C): Hemoglobin A1C: 7.9 % — AB (ref 4.0–5.6)

## 2022-08-24 NOTE — Assessment & Plan Note (Addendum)
A1c has increased.  Advised patient to consider increasing Rybelsus from 7 mg daily to 14 mg daily.  He would prefer to work on dietary changes.  Advised to start MiraLAX every third day or every other day to further regulate bowels. we discussed low glycemic diet and increasing exercise.  Continue metformin 1000 mg

## 2022-08-24 NOTE — Assessment & Plan Note (Signed)
Chronic, stable.  Continue  losartan 100mg for renal protection.  

## 2022-08-24 NOTE — Progress Notes (Signed)
Assessment & Plan:  Elevated glucose -     POCT glycosylated hemoglobin (Hb A1C)  Type 2 diabetes mellitus with hyperglycemia, without long-term current use of insulin (HCC) Assessment & Plan: A1c has increased.  Advised patient to consider increasing Rybelsus from 7 mg daily to 14 mg daily.  He would prefer to work on dietary changes.  Advised to start MiraLAX every third day or every other day to further regulate bowels. we discussed low glycemic diet and increasing exercise.  Continue metformin 1000 mg   Hypertension, unspecified type Assessment & Plan: Chronic, stable.  Continue  losartan 100mg  for renal protection.       Return precautions given.   Risks, benefits, and alternatives of the medications and treatment plan prescribed today were discussed, and patient expressed understanding.   Education regarding symptom management and diagnosis given to patient on AVS either electronically or printed.  No follow-ups on file.  Rennie Plowman, FNP  Subjective:    Patient ID: Joseph Davis, male    DOB: [birth year], 52 y.o.   MRN: [id number]  CC: Joseph Davis is a 52 y.o. male who presents today for follow up.   HPI: Accompanied by mother today  Endorses dietary indiscretion while on vacation  Overall feels well on Rybelsus he has noticed that's are more difficult to pass.  No abdominal pain or nausea.  Compliant metformin 1000 mg twice daily     Allergies: Patient has no known allergies. Current Outpatient Medications on File Prior to Visit  Medication Sig Dispense Refill   aspirin EC 81 MG tablet Take 81 mg by mouth daily.     blood glucose meter kit and supplies Dispense based on patient and insurance preference. Use up to four times daily as directed. (FOR ICD-10 E10.9, E11.9).  Check blood sugar up to 4 times per day. 1 each 0   Cholecalciferol 50 MCG (2000 UT) TABS Take 1 capsule by mouth daily.     losartan (COZAAR) 100 MG tablet Take 1  tablet (100 mg total) by mouth daily. 90 tablet 3   metFORMIN (GLUCOPHAGE) 1000 MG tablet Take 1 tablet (1,000 mg total) by mouth 2 (two) times daily with a meal. 180 tablet 3   rosuvastatin (CRESTOR) 10 MG tablet Take 1 tablet by mouth once daily 90 tablet 0   Semaglutide (RYBELSUS) 7 MG TABS Take 1 tablet (7 mg total) by mouth daily. 30 tablet 2   thiamine 100 MG tablet Take 1 tablet by mouth daily.     vitamin B-12 (CYANOCOBALAMIN) 500 MCG tablet Take 1 tablet by mouth daily.     No current facility-administered medications on file prior to visit.    Review of Systems  Constitutional:  Negative for chills and fever.  Respiratory:  Negative for cough.   Cardiovascular:  Negative for chest pain and palpitations.  Gastrointestinal:  Negative for nausea and vomiting.      Objective:    BP 130/82   Pulse 95   Temp 98.2 F (36.8 C) (Oral)   Ht 5\' 9"  (1.753 m)   Wt 208 lb 3.2 oz (94.4 kg)   SpO2 98%   BMI 30.75 kg/m  BP Readings from Last 3 Encounters:  08/24/22 130/82  05/25/22 128/82  02/22/22 136/80   Wt Readings from Last 3 Encounters:  08/24/22 208 lb 3.2 oz (94.4 kg)  05/25/22 209 lb (94.8 kg)  02/22/22 207 lb 12.8 oz (94.3 kg)    Physical Exam Vitals reviewed.  Constitutional:  Appearance: He is well-developed.  Cardiovascular:     Rate and Rhythm: Regular rhythm.     Heart sounds: Normal heart sounds.  Pulmonary:     Effort: Pulmonary effort is normal. No respiratory distress.     Breath sounds: Normal breath sounds. No wheezing, rhonchi or rales.  Skin:    General: Skin is warm and dry.  Neurological:     Mental Status: He is alert.  Psychiatric:        Speech: Speech normal.        Behavior: Behavior normal.

## 2022-08-24 NOTE — Patient Instructions (Signed)
Please work on dietary modifications as discussed

## 2022-09-09 ENCOUNTER — Telehealth: Payer: Self-pay

## 2022-09-09 NOTE — Telephone Encounter (Signed)
-----   Message from Allegra Grana, FNP sent at 08/24/2022  8:42 AM EDT ----- [facility] [address] Please abstract to chart

## 2022-10-15 ENCOUNTER — Other Ambulatory Visit: Payer: Self-pay | Admitting: Family

## 2022-10-15 DIAGNOSIS — I10 Essential (primary) hypertension: Secondary | ICD-10-CM

## 2022-10-16 ENCOUNTER — Other Ambulatory Visit: Payer: Self-pay | Admitting: Family

## 2022-10-16 DIAGNOSIS — E785 Hyperlipidemia, unspecified: Secondary | ICD-10-CM

## 2022-10-24 ENCOUNTER — Other Ambulatory Visit: Payer: Self-pay | Admitting: Family

## 2022-10-24 DIAGNOSIS — I10 Essential (primary) hypertension: Secondary | ICD-10-CM

## 2022-10-26 ENCOUNTER — Telehealth: Payer: Self-pay

## 2022-10-26 DIAGNOSIS — I10 Essential (primary) hypertension: Secondary | ICD-10-CM

## 2022-10-26 NOTE — Telephone Encounter (Signed)
Prescription Request  10/26/2022  LOV: Visit date not found  What is the name of the medication or equipment?  losartan (COZAAR) 100 MG tablet   Have you contacted your pharmacy to request a refill? Yes   Which pharmacy would you like this sent to?  [facility] [address], [place] - [address] [place] [address] Phone: [phone] Fax: [phone]    Patient notified that their request is being sent to the clinical staff for review and that they should receive a response within 2 business days.   Please advise at Mobile [phone] (mobile)  Patient states he spoke with his pharmacy on Monday (10/24/2022).  Patient states he has enough of this medication for today and tomorrow and then he will be out.

## 2022-10-27 MED ORDER — LOSARTAN POTASSIUM 100 MG PO TABS: 100.0000 mg | ORAL_TABLET | Freq: Every day | ORAL | 3 refills | Status: DC

## 2022-10-27 NOTE — Telephone Encounter (Signed)
RX sent in to pharmacy pt is aware 

## 2022-12-26 ENCOUNTER — Ambulatory Visit (INDEPENDENT_AMBULATORY_CARE_PROVIDER_SITE_OTHER): Payer: BC Managed Care – PPO | Admitting: Family

## 2022-12-26 ENCOUNTER — Encounter: Payer: Self-pay | Admitting: Family

## 2022-12-26 VITALS — BP 128/86 | HR 86 | Temp 98.4°F | Ht 69.0 in | Wt 204.8 lb

## 2022-12-26 DIAGNOSIS — I10 Essential (primary) hypertension: Secondary | ICD-10-CM | POA: Diagnosis not present

## 2022-12-26 DIAGNOSIS — E785 Hyperlipidemia, unspecified: Secondary | ICD-10-CM | POA: Diagnosis not present

## 2022-12-26 DIAGNOSIS — Z7984 Long term (current) use of oral hypoglycemic drugs: Secondary | ICD-10-CM

## 2022-12-26 DIAGNOSIS — E538 Deficiency of other specified B group vitamins: Secondary | ICD-10-CM | POA: Diagnosis not present

## 2022-12-26 DIAGNOSIS — Z23 Encounter for immunization: Secondary | ICD-10-CM | POA: Diagnosis not present

## 2022-12-26 DIAGNOSIS — R7309 Other abnormal glucose: Secondary | ICD-10-CM

## 2022-12-26 DIAGNOSIS — E1165 Type 2 diabetes mellitus with hyperglycemia: Secondary | ICD-10-CM

## 2022-12-26 DIAGNOSIS — Z8639 Personal history of other endocrine, nutritional and metabolic disease: Secondary | ICD-10-CM

## 2022-12-26 LAB — POCT GLYCOSYLATED HEMOGLOBIN (HGB A1C): Hemoglobin A1C: 8.4 % — AB (ref 4.0–5.6)

## 2022-12-26 MED ORDER — LOSARTAN POTASSIUM 100 MG PO TABS: 100.0000 mg | ORAL_TABLET | Freq: Every day | ORAL | 3 refills | Status: DC

## 2022-12-26 MED ORDER — GLIPIZIDE 5 MG PO TABS
5.0000 mg | ORAL_TABLET | Freq: Two times a day (BID) | ORAL | 3 refills | Status: DC
Start: 2022-12-26 — End: 2023-10-03

## 2022-12-26 MED ORDER — ROSUVASTATIN CALCIUM 10 MG PO TABS: 10.0000 mg | ORAL_TABLET | Freq: Every day | ORAL | 3 refills | Status: DC

## 2022-12-26 MED ORDER — METFORMIN HCL 1000 MG PO TABS: 1000.0000 mg | ORAL_TABLET | Freq: Two times a day (BID) | ORAL | 3 refills | Status: DC

## 2022-12-26 NOTE — Addendum Note (Signed)
Addended by: [place], Donnamarie Shankles on: 12/26/2022 09:48 AM   Modules accepted: Orders

## 2022-12-26 NOTE — Progress Notes (Signed)
Assessment & Plan:  Type 2 diabetes mellitus with hyperglycemia, without long-term current use of insulin (HCC) Assessment & Plan: Lab Results  Component Value Date   HGBA1C 8.4 (A) 12/26/2022  Increased from prior.  Patient intolerant to Rybelsus due to constipation (BM every 3 days).  We discussed starting long-acting insulin.  Patient politely declined as he does not feel confident in checking his blood sugar and his mother is not able to aid him on a daily basis.  We discussed non-insulin therapies and agreed to start glipizide 5 mg twice daily with his largest meals (breakfast and dinner).  Will also arrange a consult with pharmacy for further diabetic evaluation.  Stop rybelsus.  Patient previously on Januvia without improvement in glycemic control.  Counseled on risk of hypoglycemia with glipizide.  Close follow-up   Orders: -     metFORMIN HCl; Take 1 tablet (1,000 mg total) by mouth 2 (two) times daily with a meal.  Dispense: 180 tablet; Refill: 3 -     Rosuvastatin Calcium; Take 1 tablet (10 mg total) by mouth daily.  Dispense: 90 tablet; Refill: 3 -     glipiZIDE; Take 1 tablet (5 mg total) by mouth 2 (two) times daily before a meal. Before breakfast and dinner  Dispense: 180 tablet; Refill: 3 -     AMB Referral to Pharmacy Medication Management  Elevated glucose -     POCT glycosylated hemoglobin (Hb A1C)  Hypertension, unspecified type Assessment & Plan: Chronic, stable.  Continue  losartan 100mg  for renal protection.   Orders: -     Losartan Potassium; Take 1 tablet (100 mg total) by mouth daily.  Dispense: 90 tablet; Refill: 3 -     Comprehensive metabolic panel  Hyperlipidemia, unspecified hyperlipidemia type -     Rosuvastatin Calcium; Take 1 tablet (10 mg total) by mouth daily.  Dispense: 90 tablet; Refill: 3  B12 deficiency -     B12 and Folate Panel -     Intrinsic Factor Antibodies  History of vitamin D deficiency -     VITAMIN D 25 Hydroxy (Vit-D  Deficiency, Fractures)     Return precautions given.   Risks, benefits, and alternatives of the medications and treatment plan prescribed today were discussed, and patient expressed understanding.   Education regarding symptom management and diagnosis given to patient on AVS either electronically or printed.  Return in about 3 months (around 03/27/2023).  Rennie Plowman, FNP  Subjective:    Patient ID: Joseph Davis, male    DOB: Jan 06, 1971, 52 y.o.   MRN: [id number]  CC: Joseph Davis is a 52 y.o. male who presents today for follow up.   HPI: Accompanied by mother Feels well today.  No complaints  Compliant with metformin, rybelsus.   He is still having constipation, BM every 3 days. This started with rybelsus.   Previously on Januvia.  Supper is biggest meal of the day.   He will eat a TV dinner such as [employer] chicken at 6:30/7pm.   For breakfast , eggs , bacon with half bowl of cereal and 2 -3 pancakes.  He sleeps during the day and skips lunch.         Allergies: Patient has no known allergies. Current Outpatient Medications on File Prior to Visit  Medication Sig Dispense Refill   aspirin EC 81 MG tablet Take 81 mg by mouth daily.     blood glucose meter kit and supplies Dispense based on patient and insurance  preference. Use up to four times daily as directed. (FOR ICD-10 E10.9, E11.9).  Check blood sugar up to 4 times per day. 1 each 0   Cholecalciferol 50 MCG (2000 UT) TABS Take 1 capsule by mouth daily.     vitamin B-12 (CYANOCOBALAMIN) 500 MCG tablet Take 1 tablet by mouth daily.     No current facility-administered medications on file prior to visit.    Review of Systems  Constitutional:  Negative for chills and fever.  Respiratory:  Negative for cough.   Cardiovascular:  Negative for chest pain and palpitations.  Gastrointestinal:  Negative for nausea and vomiting.      Objective:    BP 128/86   Pulse 86   Temp  98.4 F (36.9 C) (Oral)   Ht 5\' 9"  (1.753 m)   Wt 204 lb 12.8 oz (92.9 kg)   SpO2 96%   BMI 30.24 kg/m  BP Readings from Last 3 Encounters:  12/26/22 128/86  08/24/22 130/82  05/25/22 128/82   Wt Readings from Last 3 Encounters:  12/26/22 204 lb 12.8 oz (92.9 kg)  08/24/22 208 lb 3.2 oz (94.4 kg)  05/25/22 209 lb (94.8 kg)    Physical Exam Vitals reviewed.  Constitutional:      Appearance: He is well-developed.  Cardiovascular:     Rate and Rhythm: Regular rhythm.     Heart sounds: Normal heart sounds.  Pulmonary:     Effort: Pulmonary effort is normal. No respiratory distress.     Breath sounds: Normal breath sounds. No wheezing, rhonchi or rales.  Skin:    General: Skin is warm and dry.  Neurological:     Mental Status: He is alert.  Psychiatric:        Speech: Speech normal.        Behavior: Behavior normal.

## 2022-12-26 NOTE — Assessment & Plan Note (Signed)
Chronic, stable.  Continue  losartan 100mg  for renal protection.

## 2022-12-26 NOTE — Patient Instructions (Signed)
You may stop Rybelsus immediately.  I am concerned with constipation.  If constipation does not resolve, please let me know.  Please drink plenty water and eat a diet high in fruits and vegetables.  Fiber will help with constipation   Since we have stopped Rybelsus, in its place I am using glipizide.  This medication you will take with breakfast and dinner.  As discussed, if you were to skip breakfast or dinner, please do not take glipizide.  This medication can cause low blood sugar Please continue metformin 1000 mg twice daily  I placed a referral to our pharmacy team as well to further aid in diabetic control  Let us know if you dont hear back within a week in regards to an appointment being scheduled.   So that you are aware, if you are [facility] MyChart user , please pay attention to your MyChart messages as you may receive a MyChart message with a phone number to call and schedule this test/appointment own your own from our referral coordinator. This is a new process so I do not want you to miss this message.  If you are not a MyChart user, you will receive a phone call.

## 2022-12-26 NOTE — Assessment & Plan Note (Signed)
Lab Results  Component Value Date   HGBA1C 8.4 (A) 12/26/2022  Increased from prior.  Patient intolerant to Rybelsus due to constipation (BM every 3 days).  We discussed starting long-acting insulin.  Patient politely declined as he does not feel confident in checking his blood sugar and his mother is not able to aid him on a daily basis.  We discussed non-insulin therapies and agreed to start glipizide 5 mg twice daily with his largest meals (breakfast and dinner).  Will also arrange a consult with pharmacy for further diabetic evaluation.  Stop rybelsus.  Patient previously on Januvia without improvement in glycemic control.  Counseled on risk of hypoglycemia with glipizide.  Close follow-up

## 2022-12-29 ENCOUNTER — Telehealth: Payer: Self-pay

## 2022-12-29 NOTE — Progress Notes (Signed)
Care Guide Note  12/29/2022 Name: Joseph Davis MRN: [id number] DOB: 1970/07/21  Referred by: Allegra Grana, FNP Reason for referral : Care Coordination (Outreach to schedule with Pharmd )   Joseph Davis is a 52 y.o. year old male who is a primary care patient of Allegra Grana, FNP. Joseph Davis was referred to the pharmacist for assistance related to DM.    An unsuccessful telephone outreach was attempted today to contact the patient who was referred to the pharmacy team for assistance with medication management. Additional attempts will be made to contact the patient.   Penne Lash, RMA Care Guide [facility]  [place], [place] [address] Direct Dial: [phone] [email or web address]

## 2023-01-05 NOTE — Progress Notes (Signed)
Care Guide Note  01/05/2023 Name: Joseph Davis MRN: [id number] DOB: [birth year]  Referred by: Allegra Grana, FNP Reason for referral : Care Coordination (Outreach to schedule with Pharmd )   Joseph Davis is a 52 y.o. year old male who is a primary care patient of Allegra Grana, FNP. CHICAGO HOCHMAN was referred to the pharmacist for assistance related to DM.    Successful contact was made with the patient to discuss pharmacy services including being ready for the pharmacist to call at least 5 minutes before the scheduled appointment time, to have medication bottles and any blood sugar or blood pressure readings ready for review. The patient agreed to meet with the pharmacist via with the pharmacist via telephone visit on (date/time).  01/20/2023  Penne Lash, RMA Care Guide [facility]  [place], [place] [address] Direct Dial: [phone] [email or web address]

## 2023-01-20 ENCOUNTER — Other Ambulatory Visit: Payer: BC Managed Care – PPO | Admitting: Pharmacist

## 2023-01-20 NOTE — Progress Notes (Signed)
01/20/2023 Name: Joseph Davis MRN: [id number] DOB: [birth year]  Subjective  Chief Complaint  Patient presents with   Diabetes   Hypertension   Hyperlipidemia    Reason for visit: Joseph Davis is a 52 y.o. year old male who presented for a telephone visit.   They were referred to the pharmacist by their PCP for assistance in managing diabetes.   Care Team: Primary Care Provider: Allegra Grana, FNP Nephrologist: [facility]  Reason for visit: ?  Joseph Davis is a 52 y.o. male with a history of diabetes (type 2, Dx 2020), who presents today for an initial diabetes pharmacotherapy visit.? Pertinent PMH also includes HTN, HLD.   Known DM Complications: no known complications    Date of Last Diabetes Related Visit: 12/26/22 with PCP  At Last Diabetes Related Visit: ?  Discussed starting long-acting insulin. Patient politely declined as he does not feel confident in checking his blood sugar and his mother is not able to aid him on a daily basis. We discussed non-insulin therapies and agreed to start glipizide 5 mg twice daily with his largest meals (breakfast and dinner). ?    Since Last visit / History of Present Illness: ?  Patient reports implementing plan from last visit. Has discontinued Rybelsus and notes perceived improvement in constipation. Easier to pass BM, frequency about every 2-3 days. Denies adverse effects with initiation of glipizde.   Prescription drug coverage: Payor: BLUE CROSS BLUE SHIELD / Plan: BCBS COMM PPO / Product Type: *No Product type* / .   Reports that all medications are affordable. [facility] $4 list glipizide/metformin Current Patient Assistance: None Medication Adherence: Patient denies missing doses of their medication.    Reported DM Regimen: ?  Metformin 1000 mg twice daily Glipizide 5 mg twice daily (breakfast, dinner)  DM medications tried in the past:?  Rybelsus (constipation, BM q3d) Januvia (no  improvement in glycemia control); Reports copay was high  SMBG Per BG meter: ? Tries to check each morning, sometimes mid-day though has not checked sugars this week.  Prior to this week (since starting glipizide) - FBG: 99 mg/dL, low 301S mg/dL - Afternoon: 010 mg/dL, 932 mg/dL   Hypo/Hyperglycemia: ?  Symptoms of hypoglycemia since last visit:? no  If yes, it was treated by: n/a  Symptoms of hyperglycemia since last visit:? no - none  Reported Diet: Patient typically eats 2 meals per day. Works 3rd shift starting 10PM. Before work (~7PM): TV dinner. Recently started reading ingredients/sugars. Lunch: Often skips. 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  History of chronic kidney disease? no History of albuminuria? yes, last UACR on 05/25/22 = 0.9 mg/g (follows with [facility]) ACE/ARB - Taking losartan 100 mg daily; Urine MA/CR Ratio - normal.  Last eye exam: 12/29/21; No retinopathy present DUE Last foot exam: 08/24/2022 Tobacco Use: Never smoker Immunizations:? Flu:  Up do date  (Last: 12/26/2022), Pneumococcal: PPSV23 (05/17/2019) - Diabetes; Shingrix: Up to date (06/2021, 08/2021); Covid (x2 2021; No record of Booster)  Cardiovascular Risk Reduction History of clinical ASCVD? No*  Previously, patient reported "light stroke" in 2021 though this was facial weakness also consistent with Bell's  palsy. Patient reports going to [facility] and scans were normal. He denies hx stroke. 10-Yr ASCVD risk score: 2.9% PREVENT: 10-yr 5.8%, 30-Yr 31.7% = Borderline Risk History of heart failure? no N/A History of hyperlipidemia?  yes Current BMI: 30.1 kg/m2 (Ht 69 in, Wt 92.9 kg) Taking statin? yes; moderate intensity (rosuvastatin 10 mg) Taking aspirin? unclear if indicated; Taking   Taking SGLT-2i? no Taking GLP- 1 RA? no  Reported HTN Regimen: ?  Losartan 100 mg daily  Patient takes their blood pressure medications in the middle of the day Patient is not checking their blood pressure at home regularly.  Current blood pressure readings readings: N/A  Patient denies hypotensive s/sx. No dizziness, lightheadedness.  Patient denies hypertensive symptoms. No headache, chest pain, shortness of breath, visual changes.     _______________________________________________  Objective    Review of Systems:?  Constitutional:? No fever, chills or unintentional weight loss  Cardiovascular:? No chest pain or pressure, shortness of breath, dyspnea on exertion, orthopnea or LE edema  Pulmonary:? No cough or shortness of breath  GI:? No nausea, vomiting, constipation, diarrhea, abdominal pain, dyspepsia, change in bowel habits  Endocrine:? No polyuria, polyphagia or blurred vision  Psych:? No depression, anxiety, insomnia    Physical Examination:  Vitals:  Wt Readings from Last 3 Encounters:  12/26/22 204 lb 12.8 oz (92.9 kg)  08/24/22 208 lb 3.2 oz (94.4 kg)  05/25/22 209 lb (94.8 kg)   BP Readings from Last 3 Encounters:  12/26/22 128/86  08/24/22 130/82  05/25/22 128/82   Pulse Readings from Last 3 Encounters:  12/26/22 86  08/24/22 95  02/22/22 70     Labs:?  Lab Results  Component Value Date   HGBA1C 8.4 (A) 12/26/2022   HGBA1C 7.9 (A) 08/24/2022   HGBA1C 7.5 (A) 05/25/2022   GLUCOSE 79 05/25/2022   MICRALBCREAT 0.9 05/25/2022   MICRALBCREAT 4.6 11/22/2021   MICRALBCREAT 1.5 01/20/2020   CREATININE 1.14 05/25/2022   CREATININE 0.98 08/11/2021   CREATININE 0.96 04/30/2021   GFR 73.58 05/25/2022   GFR 88.71 08/11/2021   GFR 81.00 02/22/2021    Lab Results  Component Value Date   CHOL 91  05/25/2022   LDLCALC 47 05/25/2022   LDLCALC 39 02/22/2021   LDLCALC 41 10/16/2019   HDL 32.50 (L) 05/25/2022   HDL 34.70 (L) 02/22/2021   HDL 37.10 (L) 10/16/2019   AST 32 05/25/2022   AST 37 08/11/2021   ALT 28 05/25/2022   ALT 32 08/11/2021      Chemistry      Component Value Date/Time   NA 135 05/25/2022 0909   NA 138 11/26/2012 1306   K 4.2 05/25/2022 0909   K 3.6 11/26/2012 1306   CL 100 05/25/2022 0909   CL 105 11/26/2012 1306   CO2 27 05/25/2022 0909   CO2 31 11/26/2012 1306   BUN 21 05/25/2022 0909   BUN 11 11/26/2012 1306   CREATININE 1.14 05/25/2022 0909   CREATININE 1.07 11/26/2012 1306      Component Value Date/Time   CALCIUM 10.0 05/25/2022 0909   CALCIUM 9.3 11/26/2012 1306   ALKPHOS 59 05/25/2022 0909   ALKPHOS 83 11/26/2012 1306   AST 32 05/25/2022 0909   AST 54 (H) 11/26/2012 1306   ALT 28 05/25/2022 0909   ALT 52 11/26/2012 1306   BILITOT 0.7 05/25/2022 0909   BILITOT 0.5 11/26/2012 1306       The ASCVD Risk score (Arnett DK, et Davis., 2019) failed to  calculate for the following reasons:   The patient has a prior MI or stroke diagnosis  Assessment and Plan:   1. Diabetes, type 2: Uncontrolled per last A1c of 8.4% (12/26/22), worsened from previous 7.9% (08/24/22) with goal <7% without hypoglycemia. Has not checked BG this week, previously recalls FBG 90s-low 100s mg/dL. Denies s/sx hypoglycemia.  Current Regimen: metformin 1000 mg BID, glipizide 5 mg BID (before breakfast/dinner) Continue medications today without changes.  Diet: Continue to check food labels to better understand sugar. Opt for lower carb/sugar TV dinners, or fresher prepared meals. Diet drinks.  Exercise: Continue current activity. As able, small walks at home [employer], chores, yard work)  Reviewed signs/symptoms/treatment of hypoglycemia. Discussed Rule of 15 for BG <70 mg/dL. Instructed patient to call and let us know if he sees any BG <70 mg/dL.  Next A1c due ~03/2023 Future  Consideration: GLP1-RA: Constipation w Rybelsus previously. Would consider injectable GLP1 in the future if patient amenable (autoinjector may be easier - eg Trulicity, Mounjaro). Seems as though baseline BM is 2-3 days (q3 days on Rybelsus). Consider Trulicity + Miralax prn (especially if any future c/f hypoglycemia on glipizide) SGLT2i: Reasonable. 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  TZD: Avoiding due to possible weight gain and possible increase in fracture risk. Not unreasonable to consider in the future, especially in the setting of relatively-new dx (~2021) Insulin: Not unreasonable, though defer per patient preference. Would be inclined to offer injectible GLP1RA first. Other safer options at this time.     2. HTN: Controlled based on last clinic BP of 128/86 mmHg (12/26/22), goal <130/80 mmHg. Does not  monitor BP at home. Denies lightheadedness, dizziness, SOB, CP, vision changes.  Current Regimen: losartan 100 mg daily Continue medications without changes.    3. ASCVD (primary prevention): On last lipid panel (05/25/22) LDL was 47 mg/dL, at goal of <85 mg/dL for primary prevention with DM2.  Key risk factors include: diabetes, hypertension, hyperlipidemia, and BMI >30 kg/m2 ASCVD 10-Yr: 2.9% indicates low risk PREVENT: 10-yr 5.8%, 30-Yr 31.7% = Borderline Risk Current Regimen: rosuvastatin 10 mg daily Continue medications today without changes. Lipids, BP well controlled. Focus on A1c reduction/Diet to minimize risk further.    4. Healthcare Maintenance:  Pneumococcal - Current status: Up to date Shingles - Current status: Up to date Influenza - Current status: up to date  Due to receive the following vaccines: Covid Booster   Follow Up Follow up with clinical pharmacist via phone in 4 weeks.  ?   Future Appointments  Date Time Provider Department Center  02/17/2023  9:00 AM [facility] C[facility] PHARMACIST [facility]   03/28/2023  8:30 AM Arnett, Lyn Records, FNP [facility]     Loree Fee, PharmD Clinical Pharmacist [facility] [phone]

## 2023-02-17 ENCOUNTER — Encounter: Payer: Self-pay | Admitting: Pharmacist

## 2023-02-17 ENCOUNTER — Other Ambulatory Visit: Payer: BC Managed Care – PPO

## 2023-02-17 NOTE — Progress Notes (Signed)
Attempted to contact patient for scheduled appointment for medication management. Left HIPAA compliant message for patient to return my call at their convenience.   Loree Fee, PharmD Clinical Pharmacist [facility] [phone]

## 2023-02-17 NOTE — Progress Notes (Deleted)
02/17/2023 Name: Joseph Davis MRN: [id number] DOB: 31-Oct-1970  Subjective  No chief complaint on file.   Reason for visit: Joseph Davis is a 52 y.o. year old male who presented for a telephone visit.   They were referred to the pharmacist by their PCP for assistance in managing diabetes.   Care Team: Primary Care Provider: Allegra Grana, FNP Nephrologist: [facility]  Reason for visit: ?  [name] is a 52 y.o. male with a history of diabetes (type 2, Dx 2020), who presents today for an initial diabetes pharmacotherapy visit.? Pertinent PMH also includes HTN, HLD.   Known DM Complications: no known complications    Date of Last Diabetes Related Visit: 12/26/22 with PCP; 01/20/23 with PharmD At Last Diabetes Related Visit: ?  9/16: PCP discussed long-acting insulin. Declined. Does not feel confident SMBG, mother not able to aid him on a daily basis. Start glipizide 5 mg twice daily (breakfast and dinner). ?    Since Last visit / History of Present Illness: ?  Patient reports implementing plan from last visit. Has discontinued Rybelsus and notes perceived improvement in constipation. Easier to pass BM, frequency about every 2-3 days. Denies adverse effects with initiation of glipizde.   Prescription drug coverage: Payor: BLUE CROSS BLUE SHIELD / Plan: BCBS COMM PPO / Product Type: *No Product type* / .   Reports that all medications are affordable. [facility] $4 list glipizide/metformin Current Patient Assistance: None Medication Adherence: Patient denies missing doses of their medication.    Reported DM Regimen: ?  Metformin 1000 mg twice daily Glipizide 5 mg twice daily (breakfast, dinner)  DM medications tried in the past:?  Rybelsus (constipation, BM q3d) Januvia (no improvement in glycemia control); Reports copay was high  SMBG Per BG meter: ? Tries to check each morning, sometimes mid-day though has not checked sugars this week.   Prior to this week (since starting glipizide) - FBG: 99 mg/dL, low 409W mg/dL - Afternoon: 119 mg/dL, 147 mg/dL   Hypo/Hyperglycemia: ?  Symptoms of hypoglycemia since last visit:? no  If yes, it was treated by: n/a  Symptoms of hyperglycemia since last visit:? no - none  Reported Diet: Patient typically eats 2 meals per day. Works 3rd shift starting 10PM. Before work (~7PM): TV dinner. Recently started reading ingredients/sugars. Lunch: Often skips. 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  History of chronic kidney disease? no History of albuminuria? yes, last UACR on 05/25/22 = 0.9 mg/g (follows with [facility]) ACE/ARB - Taking losartan 100 mg daily; Urine MA/CR Ratio - normal.  Last eye exam: 12/29/21; No retinopathy present DUE Last foot exam: 08/24/2022 Tobacco Use: Never smoker Immunizations:? Flu:  Up do date  (Last: 12/26/2022), Pneumococcal: PPSV23 (05/17/2019) - Diabetes; Shingrix: Up to date (06/2021, 08/2021); Covid (x2 2021; No record of Booster)  Cardiovascular Risk Reduction History of clinical ASCVD? No*  Previously, patient reported "light stroke" in 2021 though this was facial weakness also consistent with Bell's palsy. Patient reports [facility] and [facility] were normal. He denies hx stroke. 10-Yr ASCVD risk score: 2.9% PREVENT: 10-yr 5.8%, 30-Yr 31.7% = Borderline Risk History of  heart failure? no N/A History of hyperlipidemia? yes Current BMI: 30.1 kg/m2 (Ht 69 in, Wt 92.9 kg) Taking statin? yes; moderate intensity (rosuvastatin 10 mg) Taking aspirin? unclear if indicated; Taking   Taking  SGLT-2i? no Taking GLP- 1 RA? no  Reported HTN Regimen: ?  Losartan 100 mg daily  Patient takes their blood pressure medications in the middle of the day Patient is not checking their blood pressure at home regularly.  Current blood pressure readings readings: N/A  Patient denies hypotensive s/sx. No dizziness, lightheadedness.  Patient denies hypertensive symptoms. No headache, chest pain, shortness of breath, visual changes.     _______________________________________________  Objective    Review of Systems:?  Constitutional:? No fever, chills or unintentional weight loss  Cardiovascular:? No chest pain or pressure, shortness of breath, dyspnea on exertion, orthopnea or LE edema  Pulmonary:? No cough or shortness of breath  GI:? No nausea, vomiting, constipation, diarrhea, abdominal pain, dyspepsia, change in bowel habits  Endocrine:? No polyuria, polyphagia or blurred vision  Psych:? No depression, anxiety, insomnia    Physical Examination:  Vitals:  Wt Readings from Last 3 Encounters:  12/26/22 204 lb 12.8 oz (92.9 kg)  08/24/22 208 lb 3.2 oz (94.4 kg)  05/25/22 209 lb (94.8 kg)   BP Readings from Last 3 Encounters:  12/26/22 128/86  08/24/22 130/82  05/25/22 128/82   Pulse Readings from Last 3 Encounters:  12/26/22 86  08/24/22 95  02/22/22 70     Labs:?  Lab Results  Component Value Date   HGBA1C 8.4 (A) 12/26/2022   HGBA1C 7.9 (A) 08/24/2022   HGBA1C 7.5 (A) 05/25/2022   GLUCOSE 79 05/25/2022   MICRALBCREAT 0.9 05/25/2022   MICRALBCREAT 4.6 11/22/2021   MICRALBCREAT 1.5 01/20/2020   CREATININE 1.14 05/25/2022   CREATININE 0.98 08/11/2021   CREATININE 0.96 04/30/2021   GFR 73.58 05/25/2022   GFR 88.71 08/11/2021   GFR 81.00 02/22/2021    Lab Results  Component Value Date   CHOL 91 05/25/2022   LDLCALC 47 05/25/2022   LDLCALC 39 02/22/2021   LDLCALC 41 10/16/2019   HDL 32.50 (L) 05/25/2022   HDL 34.70 (L) 02/22/2021   HDL 37.10 (L) 10/16/2019    AST 32 05/25/2022   AST 37 08/11/2021   ALT 28 05/25/2022   ALT 32 08/11/2021      Chemistry      Component Value Date/Time   NA 135 05/25/2022 0909   NA 138 11/26/2012 1306   K 4.2 05/25/2022 0909   K 3.6 11/26/2012 1306   CL 100 05/25/2022 0909   CL 105 11/26/2012 1306   CO2 27 05/25/2022 0909   CO2 31 11/26/2012 1306   BUN 21 05/25/2022 0909   BUN 11 11/26/2012 1306   CREATININE 1.14 05/25/2022 0909   CREATININE 1.07 11/26/2012 1306      Component Value Date/Time   CALCIUM 10.0 05/25/2022 0909   CALCIUM 9.3 11/26/2012 1306   ALKPHOS 59 05/25/2022 0909   ALKPHOS 83 11/26/2012 1306   AST 32 05/25/2022 0909   AST 54 (H) 11/26/2012 1306   ALT 28 05/25/2022 0909   ALT 52 11/26/2012 1306   BILITOT 0.7 05/25/2022 0909   BILITOT 0.5 11/26/2012 1306       The ASCVD Risk score (Arnett DK, et al., 2019) failed to calculate for the following reasons:   The patient has a prior MI or stroke diagnosis  Assessment and Plan:   1. Diabetes, type 2: Uncontrolled per last A1c  of 8.4% (12/26/22), worsened from previous 7.9% (08/24/22) with goal <7% without hypoglycemia. Has not checked BG this week, previously recalls FBG 90s-low 100s mg/dL. Denies s/sx hypoglycemia.  Current Regimen: metformin 1000 mg BID, glipizide 5 mg BID (before breakfast/dinner) Continue medications today without changes.  Diet: Continue to check food labels to better understand sugar. Opt for lower carb/sugar TV dinners, or fresher prepared meals. Diet drinks.  Exercise: Continue current activity. As able, small walks at home [employer], chores, yard work)  Reviewed signs/symptoms/treatment of hypoglycemia. Discussed Rule of 15 for BG <70 mg/dL. Instructed patient to call and let us know if he sees any BG <70 mg/dL.  Next A1c due ~03/2023 Future Consideration: GLP1-RA: Constipation w Rybelsus previously. Would consider injectable GLP1 in the future if patient amenable (autoinjector may be easier - eg Trulicity,  Mounjaro). Seems as though baseline BM is 2-3 days (q3 days on Rybelsus). Consider Trulicity + Miralax prn (especially if any future c/f hypoglycemia on glipizide) SGLT2i: Reasonable. 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  TZD: Avoiding due to possible weight gain and possible increase in fracture risk. Not unreasonable to consider in the future, especially in the setting of relatively-new dx (~2021) Insulin: Not unreasonable, though defer per patient preference. Would be inclined to offer injectible GLP1RA first. Other safer options at this time.     2. HTN: Controlled based on last clinic BP of 128/86 mmHg (12/26/22), goal <130/80 mmHg. Does not  monitor BP at home. Denies lightheadedness, dizziness, SOB, CP, vision changes.  Current Regimen: losartan 100 mg daily Continue medications without changes.    3. ASCVD (primary prevention): On last lipid panel (05/25/22) LDL was 47 mg/dL, at goal of <29 mg/dL for primary prevention with DM2.  Key risk factors include: diabetes, hypertension, hyperlipidemia, and BMI >30 kg/m2 ASCVD 10-Yr: 2.9% indicates low risk PREVENT: 10-yr 5.8%, 30-Yr 31.7% = Borderline Risk Current Regimen: rosuvastatin 10 mg daily Continue medications today without changes. Lipids, BP well controlled. Focus on A1c reduction/Diet to minimize risk further.    4. Healthcare Maintenance:  Pneumococcal - Current status: Up to date Shingles - Current status: Up to date Influenza - Current status: up to date  Due to receive the following vaccines: Covid Booster   Follow Up Follow up with clinical pharmacist via phone in 4 weeks.  ?   Future Appointments  Date Time Provider Department Center  02/17/2023  9:00 AM [facility] CCM PHARMACIST [facility]  03/28/2023  8:30 AM Arnett, Lyn Records, FNP [facility]    Loree Fee, PharmD Clinical Pharmacist [facility] [phone]

## 2023-03-28 ENCOUNTER — Encounter: Payer: Self-pay | Admitting: Family

## 2023-03-28 ENCOUNTER — Ambulatory Visit: Payer: BC Managed Care – PPO | Admitting: Family

## 2023-03-28 VITALS — BP 130/84 | HR 97 | Temp 98.0°F | Ht 69.0 in | Wt 210.0 lb

## 2023-03-28 DIAGNOSIS — Z7984 Long term (current) use of oral hypoglycemic drugs: Secondary | ICD-10-CM

## 2023-03-28 DIAGNOSIS — R7309 Other abnormal glucose: Secondary | ICD-10-CM | POA: Diagnosis not present

## 2023-03-28 DIAGNOSIS — E1165 Type 2 diabetes mellitus with hyperglycemia: Secondary | ICD-10-CM

## 2023-03-28 DIAGNOSIS — I10 Essential (primary) hypertension: Secondary | ICD-10-CM

## 2023-03-28 LAB — COMPREHENSIVE METABOLIC PANEL
ALT: 23 U/L (ref 0–53)
AST: 27 U/L (ref 0–37)
Albumin: 4.7 g/dL (ref 3.5–5.2)
Alkaline Phosphatase: 75 U/L (ref 39–117)
BUN: 17 mg/dL (ref 6–23)
CO2: 28 meq/L (ref 19–32)
Calcium: 9.7 mg/dL (ref 8.4–10.5)
Chloride: 100 meq/L (ref 96–112)
Creatinine, Ser: 1.12 mg/dL (ref 0.40–1.50)
GFR: 74.72 mL/min (ref >60.00)
Glucose, Bld: 78 mg/dL (ref 70–99)
Potassium: 4.2 meq/L (ref 3.5–5.1)
Sodium: 136 meq/L (ref 135–145)
Total Bilirubin: 0.4 mg/dL (ref 0.2–1.2)
Total Protein: 7.6 g/dL (ref 6.0–8.3)

## 2023-03-28 LAB — B12 AND FOLATE PANEL
Folate: >24.2 ng/mL (ref >5.9)
Vitamin B-12: 932 pg/mL — ABNORMAL HIGH (ref 211–911)

## 2023-03-28 LAB — POCT GLYCOSYLATED HEMOGLOBIN (HGB A1C): Hemoglobin A1C: 6.8 % — AB (ref 4.0–5.6)

## 2023-03-28 LAB — VITAMIN D 25 HYDROXY (VIT D DEFICIENCY, FRACTURES): VITD: 47.68 ng/mL (ref 30.00–100.00)

## 2023-03-28 NOTE — Assessment & Plan Note (Signed)
Lab Results  Component Value Date   HGBA1C 6.8 (A) 03/28/2023   Excellent control.  Continue glipizide 5 mg with breakfast and dinner and metformin 1000 mg twice daily.  1 episode of hypoglycemia.  Counseled patient symptoms of hypoglycemia and on holding glipizide for low carbohydrate meal and if he skips a meal.  Patient declines decreasing glipizide at this time.

## 2023-03-28 NOTE — Progress Notes (Signed)
Assessment & Plan:  Elevated glucose -     POCT glycosylated hemoglobin (Hb A1C)  Hypertension, unspecified type Assessment & Plan: Chronic, stable.  Continue  losartan 100mg  for renal protection, blood pressure control.     Type 2 diabetes mellitus with hyperglycemia, without long-term current use of insulin [facility]) Assessment & Plan: Lab Results  Component Value Date   HGBA1C 6.8 (A) 03/28/2023   Excellent control.  Continue glipizide 5 mg with breakfast and dinner and metformin 1000 mg twice daily.  1 episode of hypoglycemia.  Counseled patient symptoms of hypoglycemia and on holding glipizide for low carbohydrate meal and if he skips a meal.  Patient declines decreasing glipizide at this time.      Return precautions given.   Risks, benefits, and alternatives of the medications and treatment plan prescribed today were discussed, and patient expressed understanding.   Education regarding symptom management and diagnosis given to patient on AVS either electronically or printed.  Return in about 3 months (around 06/26/2023).  Rennie Plowman, FNP  Subjective:    Patient ID: Joseph Davis, male    DOB: [birth year], 52 y.o.   MRN: [id number]  CC: Joseph Davis is a 52 y.o. male who presents today for follow up.   HPI: One episode of feeling low blood sugar yesterday evenign at work after dinner. Resolved quickly with a snack.     Compliant with glipizide 5 mg breakfast and dinner, metformin 1000mg  BID  Compliant with B12 500 international units daily   Allergies: Semaglutide(0.25 or 0.5mg -dos) Current Outpatient Medications on File Prior to Visit  Medication Sig Dispense Refill   aspirin EC 81 MG tablet Take 81 mg by mouth daily.     blood glucose meter kit and supplies Dispense based on patient and insurance preference. Use up to four times daily as directed. (FOR ICD-10 E10.9, E11.9).  Check blood sugar up to 4 times per day. 1 each 0    Cholecalciferol 50 MCG (2000 UT) TABS Take 1 capsule by mouth daily.     glipiZIDE (GLUCOTROL) 5 MG tablet Take 1 tablet (5 mg total) by mouth 2 (two) times daily before a meal. Before breakfast and dinner 180 tablet 3   losartan (COZAAR) 100 MG tablet Take 1 tablet (100 mg total) by mouth daily. 90 tablet 3   metFORMIN (GLUCOPHAGE) 1000 MG tablet Take 1 tablet (1,000 mg total) by mouth 2 (two) times daily with a meal. 180 tablet 3   rosuvastatin (CRESTOR) 10 MG tablet Take 1 tablet (10 mg total) by mouth daily. 90 tablet 3   vitamin B-12 (CYANOCOBALAMIN) 500 MCG tablet Take 1 tablet by mouth daily.     No current facility-administered medications on file prior to visit.    Review of Systems  Constitutional:  Negative for chills and fever.  Respiratory:  Negative for cough.   Cardiovascular:  Negative for chest pain and palpitations.  Gastrointestinal:  Negative for nausea and vomiting.      Objective:    BP 130/84   Pulse 97   Temp 98 F (36.7 C) (Oral)   Ht 5\' 9"  (1.753 m)   Wt 210 lb (95.3 kg)   SpO2 99%   BMI 31.01 kg/m  BP Readings from Last 3 Encounters:  03/28/23 130/84  12/26/22 128/86  08/24/22 130/82   Wt Readings from Last 3 Encounters:  03/28/23 210 lb (95.3 kg)  12/26/22 204 lb 12.8 oz (92.9 kg)  08/24/22 208 lb 3.2 oz (94.4 kg)  Physical Exam Vitals reviewed.  Constitutional:      Appearance: He is well-developed.  Cardiovascular:     Rate and Rhythm: Regular rhythm.     Heart sounds: Normal heart sounds.  Pulmonary:     Effort: Pulmonary effort is normal. No respiratory distress.     Breath sounds: Normal breath sounds. No wheezing, rhonchi or rales.  Skin:    General: Skin is warm and dry.  Neurological:     Mental Status: He is alert.  Psychiatric:        Speech: Speech normal.        Behavior: Behavior normal.

## 2023-03-28 NOTE — Assessment & Plan Note (Addendum)
Chronic, stable.  Continue  losartan 100mg  for renal protection, blood pressure control.

## 2023-03-28 NOTE — Addendum Note (Signed)
Addended by: [place], Aaran Enberg on: 03/28/2023 09:14 AM   Modules accepted: Orders

## 2023-03-28 NOTE — Patient Instructions (Addendum)
If ANY recurrence of low blood sugar, please let me know as I would change your regimen.  If you skip a meal Or eat a low carb meal as discussed, DO NOT take glipizide due to risk of low blood sugar.   Very nice to see you today and happy holidays!

## 2023-04-03 ENCOUNTER — Telehealth: Payer: Self-pay

## 2023-04-03 LAB — INTRINSIC FACTOR ANTIBODIES: Intrinsic Factor: NEGATIVE

## 2023-04-03 NOTE — Telephone Encounter (Signed)
-----   Message from Rennie Plowman sent at 04/01/2023  7:39 AM EST ----- Call patient B12 remains elevated.  Please confirm if he is taking any multivitamins as likely include b12.  Energy drinks and protein shakes may as well .he may suspend multivitamins  Renal function, vitamin D is normal

## 2023-04-03 NOTE — Telephone Encounter (Signed)
LVM t call back to discuss below message

## 2023-04-04 ENCOUNTER — Telehealth: Payer: Self-pay

## 2023-04-04 NOTE — Telephone Encounter (Signed)
LVM to inform pt that his  B12 remains elevated.  Please confirm if he is taking any multivitamins as likely include b12.  Energy drinks and protein shakes may as well .he may suspend multivitamins Renal function, vitamin D is normal

## 2023-04-04 NOTE — Telephone Encounter (Signed)
-----   Message from Rennie Plowman sent at 04/01/2023  7:39 AM EST ----- Call patient B12 remains elevated.  Please confirm if he is taking any multivitamins as likely include b12.  Energy drinks and protein shakes may as well .he may suspend multivitamins  Renal function, vitamin D is normal

## 2023-04-06 NOTE — Telephone Encounter (Signed)
Patient called back in and stated he he just taking b12 b6 vitamins he would like a call back

## 2023-04-06 NOTE — Telephone Encounter (Signed)
Called patient. Unable to leave message.

## 2023-04-06 NOTE — Telephone Encounter (Signed)
Copied from CRM [id number]. Topic: General - Call Back - No Documentation >> Apr 06, 2023  8:35 AM Samuel Jester B wrote: Reason for CRM: PT is requesting a call back after receiving a call from the office this morning.

## 2023-04-07 NOTE — Telephone Encounter (Signed)
Pt is aware and gave a verbal understanding.  

## 2023-04-10 ENCOUNTER — Telehealth: Payer: Self-pay

## 2023-04-10 NOTE — Telephone Encounter (Signed)
 LVM to inform pt that his  B12 remains elevated.  Please confirm if he is taking any multivitamins as likely include b12.  Energy drinks and protein shakes may as well .he may suspend multivitamins Renal function, vitamin D is normal

## 2023-04-10 NOTE — Telephone Encounter (Signed)
-----   Message from Rennie Plowman sent at 04/01/2023  7:39 AM EST ----- Call patient Joseph Davis remains elevated.  Please confirm if he is taking any multivitamins as likely include Joseph Davis.  Energy drinks and protein shakes may as well .he may suspend multivitamins  Renal function, vitamin D is normal

## 2023-06-29 ENCOUNTER — Ambulatory Visit: Payer: BC Managed Care – PPO | Admitting: Family

## 2023-06-29 ENCOUNTER — Encounter: Payer: Self-pay | Admitting: Family

## 2023-06-29 VITALS — BP 130/80 | HR 90 | Temp 97.6°F | Ht 69.0 in | Wt 210.4 lb

## 2023-06-29 DIAGNOSIS — Z125 Encounter for screening for malignant neoplasm of prostate: Secondary | ICD-10-CM

## 2023-06-29 DIAGNOSIS — E1165 Type 2 diabetes mellitus with hyperglycemia: Secondary | ICD-10-CM

## 2023-06-29 DIAGNOSIS — I10 Essential (primary) hypertension: Secondary | ICD-10-CM | POA: Diagnosis not present

## 2023-06-29 DIAGNOSIS — Z7984 Long term (current) use of oral hypoglycemic drugs: Secondary | ICD-10-CM

## 2023-06-29 DIAGNOSIS — R7309 Other abnormal glucose: Secondary | ICD-10-CM

## 2023-06-29 LAB — BASIC METABOLIC PANEL
BUN: 20 mg/dL (ref 6–23)
CO2: 28 meq/L (ref 19–32)
Calcium: 10 mg/dL (ref 8.4–10.5)
Chloride: 99 meq/L (ref 96–112)
Creatinine, Ser: 1.12 mg/dL (ref 0.40–1.50)
GFR: 74.58 mL/min (ref >60.00)
Glucose, Bld: 73 mg/dL (ref 70–99)
Potassium: 4.3 meq/L (ref 3.5–5.1)
Sodium: 136 meq/L (ref 135–145)

## 2023-06-29 LAB — CBC WITH DIFFERENTIAL/PLATELET
Basophils Absolute: 0 10*3/uL (ref 0.0–0.1)
Basophils Relative: 0.5 % (ref 0.0–3.0)
Eosinophils Absolute: 0.2 10*3/uL (ref 0.0–0.7)
Eosinophils Relative: 2.5 % (ref 0.0–5.0)
HCT: 42.7 % (ref 39.0–52.0)
Hemoglobin: 14.1 g/dL (ref 13.0–17.0)
Lymphocytes Relative: 20 % (ref 12.0–46.0)
Lymphs Abs: 1.8 10*3/uL (ref 0.7–4.0)
MCHC: 32.9 g/dL (ref 30.0–36.0)
MCV: 87.3 fl (ref 78.0–100.0)
Monocytes Absolute: 0.8 10*3/uL (ref 0.1–1.0)
Monocytes Relative: 8.6 % (ref 3.0–12.0)
Neutro Abs: 6.1 10*3/uL (ref 1.4–7.7)
Neutrophils Relative %: 68.4 % (ref 43.0–77.0)
Platelets: 277 10*3/uL (ref 150.0–400.0)
RBC: 4.9 Mil/uL (ref 4.22–5.81)
RDW: 14 % (ref 11.5–15.5)
WBC: 8.9 10*3/uL (ref 4.0–10.5)

## 2023-06-29 LAB — LIPID PANEL
Cholesterol: 101 mg/dL (ref 0–200)
HDL: 37.8 mg/dL — ABNORMAL LOW (ref >39.00)
LDL Cholesterol: 54 mg/dL (ref 0–99)
NonHDL: 63.67
Total CHOL/HDL Ratio: 3
Triglycerides: 47 mg/dL (ref 0.0–149.0)
VLDL: 9.4 mg/dL (ref 0.0–40.0)

## 2023-06-29 LAB — MICROALBUMIN / CREATININE URINE RATIO
Creatinine,U: 82 mg/dL
Microalb Creat Ratio: 10.2 mg/g (ref 0.0–30.0)
Microalb, Ur: 0.8 mg/dL (ref 0.0–1.9)

## 2023-06-29 LAB — PSA: PSA: 0.49 ng/mL (ref 0.10–4.00)

## 2023-06-29 LAB — POCT GLYCOSYLATED HEMOGLOBIN (HGB A1C): Hemoglobin A1C: 6.6 % — AB (ref 4.0–5.6)

## 2023-06-29 NOTE — Assessment & Plan Note (Addendum)
 Lab Results  Component Value Date   HGBA1C 6.6 (A) 06/29/2023   Excellent control.  Continue glipizide 5 mg with breakfast and dinner,  metformin 1000 mg twice daily.  No recurrent episodes of hypoglycemia.  Counseled patient again in regards to symptoms of hypoglycemia and on holding glipizide for low carbohydrate meal and if he skips a meal on printed AVS.  Patient declines decreasing glipizide at this time. Will monitor.

## 2023-06-29 NOTE — Progress Notes (Signed)
 Assessment & Plan:  Type 2 diabetes mellitus with hyperglycemia, without long-term current use of insulin (HCC) Assessment & Plan: Lab Results  Component Value Date   HGBA1C 6.6 (A) 06/29/2023   Excellent control.  Continue glipizide 5 mg with breakfast and dinner,  metformin 1000 mg twice daily.  No recurrent episodes of hypoglycemia.  Counseled patient again in regards to symptoms of hypoglycemia and on holding glipizide for low carbohydrate meal and if he skips a meal on printed AVS.  Patient declines decreasing glipizide at this time. Will monitor.   Orders: -     Microalbumin / creatinine urine ratio -     Lipid panel -     CBC with Differential/Platelet  Elevated glucose -     POCT glycosylated hemoglobin (Hb A1C)  Screening for prostate cancer -     PSA  Hypertension, unspecified type Assessment & Plan: Chronic, stable.  Continue  losartan 100mg  for renal protection, blood pressure control.    Orders: -     Basic metabolic panel     Return precautions given.   Risks, benefits, and alternatives of the medications and treatment plan prescribed today were discussed, and patient expressed understanding.   Education regarding symptom management and diagnosis given to patient on AVS either electronically or printed.  No follow-ups on file.  Rennie Plowman, FNP  Subjective:    Patient ID: Joseph Davis, male    DOB: 1970-12-09, 53 y.o.   MRN: [id number]  CC: Joseph Davis is a 53 y.o. male who presents today for follow up.   HPI: Feels well today.  No new complaints.  Compliant with glipizide 5 mg twice daily, metformin 1000 mg twice daily.  FBG today 99. No recurrent episodes of hypoglycemia     Allergies: Semaglutide(0.25 or 0.5mg -dos) Current Outpatient Medications on File Prior to Visit  Medication Sig Dispense Refill   aspirin EC 81 MG tablet Take 81 mg by mouth daily.     blood glucose meter kit and supplies Dispense based on patient  and insurance preference. Use up to four times daily as directed. (FOR ICD-10 E10.9, E11.9).  Check blood sugar up to 4 times per day. 1 each 0   Cholecalciferol 50 MCG (2000 UT) TABS Take 1 capsule by mouth daily.     glipiZIDE (GLUCOTROL) 5 MG tablet Take 1 tablet (5 mg total) by mouth 2 (two) times daily before a meal. Before breakfast and dinner 180 tablet 3   losartan (COZAAR) 100 MG tablet Take 1 tablet (100 mg total) by mouth daily. 90 tablet 3   metFORMIN (GLUCOPHAGE) 1000 MG tablet Take 1 tablet (1,000 mg total) by mouth 2 (two) times daily with a meal. 180 tablet 3   rosuvastatin (CRESTOR) 10 MG tablet Take 1 tablet (10 mg total) by mouth daily. 90 tablet 3   vitamin B-12 (CYANOCOBALAMIN) 500 MCG tablet Take 1 tablet by mouth daily.     No current facility-administered medications on file prior to visit.    Review of Systems  Constitutional:  Negative for chills and fever.  Respiratory:  Negative for cough.   Cardiovascular:  Negative for chest pain and palpitations.  Gastrointestinal:  Negative for nausea and vomiting.      Objective:    BP 130/80   Pulse 90   Temp 97.6 F (36.4 C) (Oral)   Ht 5\' 9"  (1.753 m)   Wt 210 lb 6.4 oz (95.4 kg)   SpO2 96%   BMI 31.07 kg/m  BP Readings from Last 3 Encounters:  06/29/23 130/80  03/28/23 130/84  12/26/22 128/86   Wt Readings from Last 3 Encounters:  06/29/23 210 lb 6.4 oz (95.4 kg)  03/28/23 210 lb (95.3 kg)  12/26/22 204 lb 12.8 oz (92.9 kg)    Physical Exam Vitals reviewed.  Constitutional:      Appearance: He is well-developed.  Cardiovascular:     Rate and Rhythm: Regular rhythm.     Heart sounds: Normal heart sounds.  Pulmonary:     Effort: Pulmonary effort is normal. No respiratory distress.     Breath sounds: Normal breath sounds. No wheezing, rhonchi or rales.  Skin:    General: Skin is warm and dry.  Neurological:     Mental Status: He is alert.  Psychiatric:        Speech: Speech normal.         Behavior: Behavior normal.

## 2023-06-29 NOTE — Assessment & Plan Note (Signed)
 Chronic, stable.  Continue  losartan 100mg  for renal protection, blood pressure control.

## 2023-06-29 NOTE — Patient Instructions (Addendum)
 Please ensure that you always to take glipizide with a meal.   Glipizide lowers blood sugar and can cause low blood sugars; if you are eating a low carb meal or skipping a meal; PLEASE SKIP THAT DOSE OF GLIPIZIDE.   A1c is lower today 6.6   Hats off to you!

## 2023-08-07 ENCOUNTER — Encounter: Payer: Self-pay | Admitting: Family

## 2023-08-07 ENCOUNTER — Telehealth: Payer: Self-pay

## 2023-08-07 NOTE — Telephone Encounter (Signed)
 I also mailed a letter to patient with this information.  Patient does not have MyChart at this time.

## 2023-08-07 NOTE — Telephone Encounter (Signed)
 I left a voicemail for patient asking him to please call us .  E2C2 - when patient calls back, please let him know that due to a change in our schedule, we have rescheduled his 10/03/2023 appointment with Bascom Bossier, FNP, from 8:30am to 10:00am.  Please assist patient in rescheduling this appointment if it does not work with his schedule.

## 2023-10-03 ENCOUNTER — Ambulatory Visit (INDEPENDENT_AMBULATORY_CARE_PROVIDER_SITE_OTHER): Admitting: Family

## 2023-10-03 ENCOUNTER — Ambulatory Visit: Admitting: Family

## 2023-10-03 ENCOUNTER — Encounter: Payer: Self-pay | Admitting: Family

## 2023-10-03 VITALS — BP 130/76 | HR 82 | Temp 97.9°F | Ht 69.0 in | Wt 208.1 lb

## 2023-10-03 DIAGNOSIS — E785 Hyperlipidemia, unspecified: Secondary | ICD-10-CM | POA: Diagnosis not present

## 2023-10-03 DIAGNOSIS — R7309 Other abnormal glucose: Secondary | ICD-10-CM

## 2023-10-03 DIAGNOSIS — E1165 Type 2 diabetes mellitus with hyperglycemia: Secondary | ICD-10-CM

## 2023-10-03 DIAGNOSIS — I10 Essential (primary) hypertension: Secondary | ICD-10-CM

## 2023-10-03 DIAGNOSIS — Z7984 Long term (current) use of oral hypoglycemic drugs: Secondary | ICD-10-CM

## 2023-10-03 LAB — POCT GLYCOSYLATED HEMOGLOBIN (HGB A1C): Hemoglobin A1C: 6.4 % — AB (ref 4.0–5.6)

## 2023-10-03 MED ORDER — ROSUVASTATIN CALCIUM 10 MG PO TABS: 10.0000 mg | ORAL_TABLET | Freq: Every day | ORAL | 3 refills | Status: DC

## 2023-10-03 MED ORDER — LOSARTAN POTASSIUM 100 MG PO TABS: 100.0000 mg | ORAL_TABLET | Freq: Every day | ORAL | 3 refills | Status: DC

## 2023-10-03 MED ORDER — METFORMIN HCL 1000 MG PO TABS: 1000.0000 mg | ORAL_TABLET | Freq: Two times a day (BID) | ORAL | 3 refills | Status: DC

## 2023-10-03 MED ORDER — BLOOD GLUCOSE METER KIT
PACK | 0 refills | Status: DC
Start: 2023-10-03 — End: 2023-10-03

## 2023-10-03 MED ORDER — GLIPIZIDE 5 MG PO TABS: 5.0000 mg | ORAL_TABLET | Freq: Two times a day (BID) | ORAL | 3 refills | Status: DC

## 2023-10-03 NOTE — Patient Instructions (Addendum)
 Please continue glipizide  5mg  twice daily WITH A MEAL.  DO NOT TAKE glipizide  if you are eating a low carb meal or skipping a meal due to RISK OF LOW BLOOD SUGAR.  Nice to see you today

## 2023-10-03 NOTE — Assessment & Plan Note (Addendum)
 Lab Results  Component Value Date   HGBA1C 6.4 (A) 10/03/2023  Improved.  Continue glipizide  5 mg with breakfast and dinner,  metformin  1000 mg twice daily.  No recurrent episodes of hypoglycemia.  Counseled patient again in regards to symptoms of hypoglycemia and on holding glipizide  for low carbohydrate meal and if he skips a meal on printed AVS.  Again, patient declines decreasing glipizide  at this time. If A1c decreases at follow up, will decrease glipizide  to 5mg  every day. Will monitor.

## 2023-10-03 NOTE — Progress Notes (Signed)
 Assessment & Plan:  Elevated glucose -     POCT glycosylated hemoglobin (Hb A1C)  Type 2 diabetes mellitus with hyperglycemia, without long-term current use of insulin (HCC) Assessment & Plan: Lab Results  Component Value Date   HGBA1C 6.4 (A) 10/03/2023  Improved.  Continue glipizide  5 mg with breakfast and dinner,  metformin  1000 mg twice daily.  No recurrent episodes of hypoglycemia.  Counseled patient again in regards to symptoms of hypoglycemia and on holding glipizide  for low carbohydrate meal and if he skips a meal on printed AVS.  Again, patient declines decreasing glipizide  at this time. If A1c decreases at follow up, will decrease glipizide  to 5mg  every day. Will monitor.   Orders: -     glipiZIDE ; Take 1 tablet (5 mg total) by mouth 2 (two) times daily before a meal. Before breakfast and dinner  Dispense: 180 tablet; Refill: 3 -     metFORMIN  HCl; Take 1 tablet (1,000 mg total) by mouth 2 (two) times daily with a meal.  Dispense: 180 tablet; Refill: 3 -     Rosuvastatin  Calcium ; Take 1 tablet (10 mg total) by mouth daily.  Dispense: 90 tablet; Refill: 3  Hypertension, unspecified type Assessment & Plan: Chronic, stable.  Continue  losartan  100mg  for renal protection, blood pressure control.    Orders: -     Losartan  Potassium; Take 1 tablet (100 mg total) by mouth daily.  Dispense: 90 tablet; Refill: 3  Hyperlipidemia, unspecified hyperlipidemia type -     Rosuvastatin  Calcium ; Take 1 tablet (10 mg total) by mouth daily.  Dispense: 90 tablet; Refill: 3     Return precautions given.   Risks, benefits, and alternatives of the medications and treatment plan prescribed today were discussed, and patient expressed understanding.   Education regarding symptom management and diagnosis given to patient on AVS either electronically or printed.  Return in about 3 months (around 01/03/2024).  Rollene Northern, FNP  Subjective:    Patient ID: METRO EDENFIELD, male     DOB: May 21, 1970, 53 y.o.   MRN: [id number]  CC: Joseph Davis is a 53 y.o. male who presents today for follow up.   HPI: He feels well today No new complaints.  He is walking a lot with work.   Denies hypoglycemic episodes nor episodes of diaphoresis. He hasn't held glipizide .   Denies CP, SOB.        Allergies: Semaglutide (0.25 or 0.5mg -dos) Current Outpatient Medications on File Prior to Visit  Medication Sig Dispense Refill   aspirin EC 81 MG tablet Take 81 mg by mouth daily.     Cholecalciferol 50 MCG (2000 UT) TABS Take 1 capsule by mouth daily.     vitamin B-12 (CYANOCOBALAMIN ) 500 MCG tablet Take 1 tablet by mouth daily.     No current facility-administered medications on file prior to visit.    Review of Systems  Constitutional:  Negative for chills and fever.  Respiratory:  Negative for cough.   Cardiovascular:  Negative for chest pain and palpitations.  Gastrointestinal:  Negative for nausea and vomiting.      Objective:    BP 130/76   Pulse 82   Temp 97.9 F (36.6 C) (Oral)   Ht 5' 9 (1.753 m)   Wt 208 lb 1.6 oz (94.4 kg)   SpO2 96%   BMI 30.73 kg/m  BP Readings from Last 3 Encounters:  10/03/23 130/76  06/29/23 130/80  03/28/23 130/84   Wt Readings from Last 3 Encounters:  10/03/23 208 lb 1.6 oz (94.4 kg)  06/29/23 210 lb 6.4 oz (95.4 kg)  03/28/23 210 lb (95.3 kg)    Physical Exam Vitals reviewed.  Constitutional:      Appearance: He is well-developed.   Cardiovascular:     Rate and Rhythm: Regular rhythm.     Heart sounds: Normal heart sounds.  Pulmonary:     Effort: Pulmonary effort is normal. No respiratory distress.     Breath sounds: Normal breath sounds. No wheezing, rhonchi or rales.   Skin:    General: Skin is warm and dry.   Neurological:     Mental Status: He is alert.   Psychiatric:        Speech: Speech normal.        Behavior: Behavior normal.

## 2023-10-03 NOTE — Assessment & Plan Note (Signed)
 Chronic, stable.  Continue  losartan 100mg  for renal protection, blood pressure control.

## 2024-01-04 ENCOUNTER — Encounter: Payer: Self-pay | Admitting: Family

## 2024-01-04 ENCOUNTER — Ambulatory Visit: Payer: Self-pay | Admitting: Family

## 2024-01-04 ENCOUNTER — Ambulatory Visit: Admitting: Family

## 2024-01-04 VITALS — BP 120/72 | HR 75 | Temp 98.1°F | Ht 69.0 in | Wt 211.4 lb

## 2024-01-04 DIAGNOSIS — E1165 Type 2 diabetes mellitus with hyperglycemia: Secondary | ICD-10-CM

## 2024-01-04 DIAGNOSIS — Z23 Encounter for immunization: Secondary | ICD-10-CM

## 2024-01-04 DIAGNOSIS — I10 Essential (primary) hypertension: Secondary | ICD-10-CM

## 2024-01-04 LAB — COMPREHENSIVE METABOLIC PANEL WITH GFR
ALT: 30 U/L (ref 0–53)
AST: 32 U/L (ref 0–37)
Albumin: 4.6 g/dL (ref 3.5–5.2)
Alkaline Phosphatase: 71 U/L (ref 39–117)
BUN: 19 mg/dL (ref 6–23)
CO2: 30 meq/L (ref 19–32)
Calcium: 9.8 mg/dL (ref 8.4–10.5)
Chloride: 99 meq/L (ref 96–112)
Creatinine, Ser: 1.24 mg/dL (ref 0.40–1.50)
GFR: 65.77 mL/min (ref >60.00)
Glucose, Bld: 76 mg/dL (ref 70–99)
Potassium: 4.4 meq/L (ref 3.5–5.1)
Sodium: 139 meq/L (ref 135–145)
Total Bilirubin: 0.6 mg/dL (ref 0.2–1.2)
Total Protein: 7.7 g/dL (ref 6.0–8.3)

## 2024-01-04 LAB — HEMOGLOBIN A1C: Hgb A1c MFr Bld: 8.1 % — ABNORMAL HIGH (ref 4.6–6.5)

## 2024-01-04 NOTE — Assessment & Plan Note (Addendum)
 Anticipate stable.  Pending A1c. Continue glipizide  5 mg with breakfast and dinner,  metformin  1000 mg twice daily.  No recurrent episodes of hypoglycemia.  Counseled on the importance of annual eye exam.  Referral placed.  Patient will call to schedule this appointment

## 2024-01-04 NOTE — Assessment & Plan Note (Signed)
 Chronic, stable.  Continue  losartan 100mg  for renal protection, blood pressure control.

## 2024-01-04 NOTE — Progress Notes (Signed)
 Assessment & Plan:  Type 2 diabetes mellitus with hyperglycemia, without long-term current use of insulin (HCC) Assessment & Plan: Anticipate stable.  Pending A1c. Continue glipizide  5 mg with breakfast and dinner,  metformin  1000 mg twice daily.  No recurrent episodes of hypoglycemia.  Counseled on the importance of annual eye exam.  Referral placed.  Patient will call to schedule this appointment  Orders: -     Ambulatory referral to Ophthalmology -     Comprehensive metabolic panel with GFR -     Hemoglobin A1c  Hypertension, unspecified type Assessment & Plan: Chronic, stable.  Continue  losartan  100mg  for renal protection, blood pressure control.        Return precautions given.   Risks, benefits, and alternatives of the medications and treatment plan prescribed today were discussed, and patient expressed understanding.   Education regarding symptom management and diagnosis given to patient on AVS either electronically or printed.  Return in about 3 months (around 04/04/2024).  Rollene Northern, FNP  Subjective:    Patient ID: Joseph Davis, male    DOB: September 24, 1970, 53 y.o.   MRN: [id number]  CC: Joseph Davis is a 53 y.o. male who presents today for follow up.   HPI: Feels well today No new complaints.   Compliant with Continue glipizide  5 mg with breakfast and dinner,  metformin  1000 mg twice daily  Denies hypoglycemic episodes.   Allergies: Semaglutide (0.25 or 0.5mg -dos) Current Outpatient Medications on File Prior to Visit  Medication Sig Dispense Refill   aspirin EC 81 MG tablet Take 81 mg by mouth daily.     Cholecalciferol 50 MCG (2000 UT) TABS Take 1 capsule by mouth daily.     glipiZIDE  (GLUCOTROL ) 5 MG tablet Take 1 tablet (5 mg total) by mouth 2 (two) times daily before a meal. Before breakfast and dinner 180 tablet 3   losartan  (COZAAR ) 100 MG tablet Take 1 tablet (100 mg total) by mouth daily. 90 tablet 3   metFORMIN  (GLUCOPHAGE )  1000 MG tablet Take 1 tablet (1,000 mg total) by mouth 2 (two) times daily with a meal. 180 tablet 3   rosuvastatin  (CRESTOR ) 10 MG tablet Take 1 tablet (10 mg total) by mouth daily. 90 tablet 3   vitamin B-12 (CYANOCOBALAMIN ) 500 MCG tablet Take 1 tablet by mouth daily.     No current facility-administered medications on file prior to visit.    Review of Systems  Constitutional:  Negative for chills and fever.  Respiratory:  Negative for cough.   Cardiovascular:  Negative for chest pain and palpitations.  Gastrointestinal:  Negative for nausea and vomiting.      Objective:    BP 120/72   Pulse 75   Temp 98.1 F (36.7 C) (Oral)   Ht 5' 9 (1.753 m)   Wt 211 lb 6.4 oz (95.9 kg)   SpO2 95%   BMI 31.22 kg/m  BP Readings from Last 3 Encounters:  01/04/24 120/72  10/03/23 130/76  06/29/23 130/80   Wt Readings from Last 3 Encounters:  01/04/24 211 lb 6.4 oz (95.9 kg)  10/03/23 208 lb 1.6 oz (94.4 kg)  06/29/23 210 lb 6.4 oz (95.4 kg)    Physical Exam Vitals reviewed.  Constitutional:      Appearance: He is well-developed.  Cardiovascular:     Rate and Rhythm: Regular rhythm.     Heart sounds: Normal heart sounds.  Pulmonary:     Effort: Pulmonary effort is normal. No respiratory distress.  Breath sounds: Normal breath sounds. No wheezing, rhonchi or rales.  Skin:    General: Skin is warm and dry.  Neurological:     Mental Status: He is alert.  Psychiatric:        Speech: Speech normal.        Behavior: Behavior normal.

## 2024-01-04 NOTE — Patient Instructions (Signed)
 We are doing your blood sugar( A1c) and kidney/ liver function in the blood work  Referral for annual diabetic exam to [facility]. You may call to schedule.

## 2024-02-28 NOTE — Telephone Encounter (Signed)
 open in error

## 2024-04-18 ENCOUNTER — Encounter: Payer: Self-pay | Admitting: Family

## 2024-04-18 ENCOUNTER — Ambulatory Visit: Payer: Self-pay | Admitting: Family

## 2024-04-18 VITALS — BP 124/70 | HR 87 | Temp 98.8°F | Ht 69.0 in | Wt 209.8 lb

## 2024-04-18 DIAGNOSIS — I10 Essential (primary) hypertension: Secondary | ICD-10-CM

## 2024-04-18 DIAGNOSIS — Z7984 Long term (current) use of oral hypoglycemic drugs: Secondary | ICD-10-CM

## 2024-04-18 DIAGNOSIS — E785 Hyperlipidemia, unspecified: Secondary | ICD-10-CM

## 2024-04-18 DIAGNOSIS — E1165 Type 2 diabetes mellitus with hyperglycemia: Secondary | ICD-10-CM

## 2024-04-18 DIAGNOSIS — R7309 Other abnormal glucose: Secondary | ICD-10-CM

## 2024-04-18 DIAGNOSIS — Z23 Encounter for immunization: Secondary | ICD-10-CM

## 2024-04-18 LAB — COMPREHENSIVE METABOLIC PANEL WITH GFR
ALT: 34 U/L (ref 3–53)
AST: 34 U/L (ref 5–37)
Albumin: 4.9 g/dL (ref 3.5–5.2)
Alkaline Phosphatase: 75 U/L (ref 39–117)
BUN: 31 mg/dL — ABNORMAL HIGH (ref 6–23)
CO2: 25 meq/L (ref 19–32)
Calcium: 9.7 mg/dL (ref 8.4–10.5)
Chloride: 102 meq/L (ref 96–112)
Creatinine, Ser: 1.22 mg/dL (ref 0.40–1.50)
GFR: 66.93 mL/min
Glucose, Bld: 75 mg/dL (ref 70–99)
Potassium: 4.3 meq/L (ref 3.5–5.1)
Sodium: 137 meq/L (ref 135–145)
Total Bilirubin: 0.5 mg/dL (ref 0.2–1.2)
Total Protein: 8.1 g/dL (ref 6.0–8.3)

## 2024-04-18 LAB — MICROALBUMIN / CREATININE URINE RATIO
Creatinine,U: 86.2 mg/dL
Microalb Creat Ratio: 16.1 mg/g (ref 0.0–30.0)
Microalb, Ur: 1.4 mg/dL (ref 0.7–1.9)

## 2024-04-18 LAB — POCT GLYCOSYLATED HEMOGLOBIN (HGB A1C): Hemoglobin A1C: 7.3 % — AB (ref 4.0–5.6)

## 2024-04-18 MED ORDER — GLIPIZIDE 5 MG PO TABS: 5.0000 mg | ORAL_TABLET | Freq: Two times a day (BID) | ORAL | 3 refills | Status: AC

## 2024-04-18 MED ORDER — LOSARTAN POTASSIUM 100 MG PO TABS: 100.0000 mg | ORAL_TABLET | Freq: Every day | ORAL | 3 refills | Status: AC

## 2024-04-18 MED ORDER — ROSUVASTATIN CALCIUM 10 MG PO TABS: 10.0000 mg | ORAL_TABLET | Freq: Every day | ORAL | 3 refills | Status: AC

## 2024-04-18 MED ORDER — METFORMIN HCL 1000 MG PO TABS: 1000.0000 mg | ORAL_TABLET | Freq: Two times a day (BID) | ORAL | 3 refills | Status: AC

## 2024-04-18 NOTE — Patient Instructions (Addendum)
 Please make an appointment for diabetic eye exam as we discussed AVOID soda. Only sugar free drinks   Diabetes: Carbohydrate Counting for Adults Carbohydrate counting is a method of keeping track of how many carbohydrates you eat. Eating carbohydrates increases the amount of sugar, also called glucose, in your blood. By counting how many carbohydrates you eat, you can improve how well you manage your blood sugar. This, in turn, helps you manage your diabetes. Carbohydrates are measured in grams (g) per serving. It's important to know how many carbohydrates (in grams or by serving size) you can have in each meal. This is different for every person. A dietitian can help you make a meal plan and calculate how many carbohydrates you should have at each meal and snack. What foods contain carbohydrates? Carbohydrates are found in these foods: Grains, such as breads and cereals. Dried beans and soy products. Starchy vegetables, such as potatoes, peas, and corn. Fruit and fruit juices. Milk and yogurt. Sweets and snack foods like cake, cookies, candy, chips, and soft drinks. How do I count carbohydrates in foods? There are two ways to count carbohydrates in food. You can read food labels or learn standard serving sizes of foods. You can use either of these methods or a combination of both. Using the Nutrition Facts label The Nutrition Facts list is included on the labels of almost all packaged foods and drinks in the [place] It includes: The serving size. Information about nutrients in each serving. This includes the grams of carbohydrate per serving. To use the Nutrition Facts, decide how many servings you will have. Then, multiply the number of servings by the number of carbohydrates per serving. The resulting number is the total grams of carbohydrates that you'll be having. Learning the standard serving sizes of foods When you eat carbohydrate foods that aren't packaged or don't include Nutrition  Facts on the label, you need to measure the servings in order to count the grams of carbohydrates. Measure the foods that you'll eat with a food scale or measuring cup, if needed. Decide how many standard-size servings you'll eat. Multiply the number of servings by 15. For foods that contain carbohydrates, one serving equals 15 g of carbohydrates. For example, if you eat 2 cups or 10 oz (300 g) of strawberries, you'll have eaten 2 servings and 30 g of carbohydrates (2 servings x 15 g = 30 g). For foods that have more than one food mixed, such as soups and casseroles, you must count the carbohydrates in each food that's included. Here's a list of standard serving sizes for common carbohydrate-rich foods. Each of these servings has about 15 g of carbohydrates: 1 slice of bread. 1 six-inch (15 cm) tortilla. ? cup or 2 oz (53 g) of cooked rice or pasta.  cup or 3 oz (85 g) of cooked or canned, drained, and rinsed beans or lentils.  cup or 3 oz (85 g) of a starchy vegetable, such as peas, corn, or squash.  cup or 4 oz (120 g) of hot cereal.  cup or 3 oz (85 g) of boiled or mashed potatoes, or  or 3 oz (85 g) of a large baked potato.  cup or 4 fl oz (118 mL) of fruit juice. 1 cup or 8 fl oz (237 mL) of milk. 1 small or 4 oz (106 g) apple.  or 2 oz (63 g) of a medium banana. 1 cup or 5 oz (150 g) of strawberries. 3 cups or 1 oz (28.3 g)  of popped popcorn. What is an example of carbohydrate counting? To calculate the grams of carbohydrates in this sample meal, follow the steps below. Sample meal 3 oz (85 g) chicken breast. ? cup or 4 oz (106 g) of brown rice.  cup or 3 oz (85 g) of corn. 1 cup or 8 fl oz (237 mL) of milk. 1 cup or 5 oz (150 g) of strawberries with sugar-free whipped topping. Carbohydrate calculation Identify the foods that have carbohydrates: Rice. Corn. Milk. Strawberries. Calculate how many servings you have of each food: 2 servings of rice. 1 serving of  corn. 1 serving of milk. 1 serving of strawberries. Multiply each number of servings by 15 g: 2 servings of rice x 15 g = 30 g. 1 serving of corn x 15 g = 15 g. 1 serving of milk x 15 g = 15 g. 1 serving of strawberries x 15 g = 15 g. Add together all of the amounts to find the total grams of carbohydrates eaten: 30 g + 15 g + 15 g + 15 g = 75 g of carbohydrates total. Where to find more information To learn more, go to: American Diabetes Association at diabetes.org. Click Search and type carb counting. Find the link you need. Centers for Disease Control and Prevention at tonerpromos.no. Click Search and type diabetes. Find the link you need. Academy of Nutrition and Dietetics: eatright.org This information is not intended to replace advice given to you by your health care provider. Make sure you discuss any questions you have with your health care provider. Document Revised: 03/15/2023 Document Reviewed: 03/15/2023 Elsevier Patient Education  2025 [employer].

## 2024-04-18 NOTE — Assessment & Plan Note (Signed)
 Chronic, stable.  Continue  losartan 100mg  for renal protection, blood pressure control.

## 2024-04-18 NOTE — Progress Notes (Signed)
 "  Assessment & Plan:  Elevated glucose -     POCT glycosylated hemoglobin (Hb A1C)  Type 2 diabetes mellitus with hyperglycemia, without long-term current use of insulin (HCC) Assessment & Plan: Chronic, improved.  Continue glipizide  5 mg with breakfast and dinner,  metformin  1000 mg twice daily.  He declines resuming Januvia  and prefers to work on low glycemic diet. Printed information on AVS.   Again counseled on the importance of annual eye exam. He will schedule at [facility].  Orders: -     glipiZIDE ; Take 1 tablet (5 mg total) by mouth 2 (two) times daily before a meal. Before breakfast and dinner  Dispense: 180 tablet; Refill: 3 -     metFORMIN  HCl; Take 1 tablet (1,000 mg total) by mouth 2 (two) times daily with a meal.  Dispense: 180 tablet; Refill: 3 -     Rosuvastatin  Calcium ; Take 1 tablet (10 mg total) by mouth daily.  Dispense: 90 tablet; Refill: 3 -     Microalbumin / creatinine urine ratio -     Comprehensive metabolic panel with GFR  Hypertension, unspecified type Assessment & Plan: Chronic, stable.  Continue  losartan  100mg  for renal protection, blood pressure control.    Orders: -     Losartan  Potassium; Take 1 tablet (100 mg total) by mouth daily.  Dispense: 90 tablet; Refill: 3  Hyperlipidemia, unspecified hyperlipidemia type -     Rosuvastatin  Calcium ; Take 1 tablet (10 mg total) by mouth daily.  Dispense: 90 tablet; Refill: 3     Return precautions given.   Risks, benefits, and alternatives of the medications and treatment plan prescribed today were discussed, and patient expressed understanding.   Education regarding symptom management and diagnosis given to patient on AVS either electronically or printed.  Return in about 3 months (around 07/17/2024).  Rollene Northern, FNP  Subjective:    Patient ID: Joseph Davis, male    DOB: 03-01-1971, 54 y.o.   MRN: [id number]  CC: Joseph Davis is a 54 y.o. male who presents today for follow up.    HPI: Feels well today  No concerns today  He drinks occasional soda. He is drinking more water.         Compliant with glipizide  5 mg with breakfast and dinner, metformin  1000 mg twice daily   Denies hypoglycemia.  Tolerating regimen well.   Previously on Januvia ; cost prohibitive.   Intolerance to Rybelsus   PVVS23 2021  Due for PCV15   Allergies: Semaglutide (0.25 or 0.5mg -dos) Medications Ordered Prior to Encounter[1]  Review of Systems  Constitutional:  Negative for chills and fever.  Respiratory:  Negative for cough.   Cardiovascular:  Negative for chest pain and palpitations.  Gastrointestinal:  Negative for nausea and vomiting.      Objective:    BP 124/70   Pulse 87   Temp 98.8 F (37.1 C) (Oral)   Ht 5' 9 (1.753 m)   Wt 209 lb 12.8 oz (95.2 kg)   SpO2 95%   BMI 30.98 kg/m  BP Readings from Last 3 Encounters:  04/18/24 124/70  01/04/24 120/72  10/03/23 130/76   Wt Readings from Last 3 Encounters:  04/18/24 209 lb 12.8 oz (95.2 kg)  01/04/24 211 lb 6.4 oz (95.9 kg)  10/03/23 208 lb 1.6 oz (94.4 kg)    Physical Exam Vitals reviewed.  Constitutional:      Appearance: He is well-developed.  Cardiovascular:     Rate and Rhythm: Regular rhythm.  Heart sounds: Normal heart sounds.  Pulmonary:     Effort: Pulmonary effort is normal. No respiratory distress.     Breath sounds: Normal breath sounds. No wheezing, rhonchi or rales.  Skin:    General: Skin is warm and dry.  Neurological:     Mental Status: He is alert.  Psychiatric:        Speech: Speech normal.        Behavior: Behavior normal.           [1]  Current Outpatient Medications on File Prior to Visit  Medication Sig Dispense Refill   aspirin EC 81 MG tablet Take 81 mg by mouth daily.     Cholecalciferol 50 MCG (2000 UT) TABS Take 1 capsule by mouth daily.     vitamin B-12 (CYANOCOBALAMIN ) 500 MCG tablet Take 1 tablet by mouth daily.     No current  facility-administered medications on file prior to visit.   "

## 2024-04-18 NOTE — Addendum Note (Signed)
 Addended by: Lalitha Ilyas on: 04/18/2024 10:15 AM   Modules accepted: Orders

## 2024-04-18 NOTE — Assessment & Plan Note (Signed)
 Chronic, improved.  Continue glipizide  5 mg with breakfast and dinner,  metformin  1000 mg twice daily.  He declines resuming Januvia  and prefers to work on low glycemic diet. Printed information on AVS.   Again counseled on the importance of annual eye exam. He will schedule at [facility].

## 2024-07-25 ENCOUNTER — Ambulatory Visit: Admitting: Family
